# Patient Record
Sex: Male | Born: 1975 | Race: White | Hispanic: No | Marital: Single | Smoking: Current every day smoker
Health system: Southern US, Community
[De-identification: ages and names within clinical notes are randomized; demographics above are authoritative.]

## PROBLEM LIST (undated history)

## (undated) DIAGNOSIS — J45909 Unspecified asthma, uncomplicated: Secondary | ICD-10-CM

---

## 2004-06-12 ENCOUNTER — Emergency Department: Payer: Self-pay | Admitting: Emergency Medicine

## 2004-07-07 ENCOUNTER — Emergency Department: Payer: Self-pay | Admitting: Emergency Medicine

## 2004-10-12 ENCOUNTER — Emergency Department: Payer: Self-pay | Admitting: Emergency Medicine

## 2005-02-11 ENCOUNTER — Emergency Department: Payer: Self-pay | Admitting: Emergency Medicine

## 2005-07-10 ENCOUNTER — Emergency Department: Payer: Self-pay | Admitting: Emergency Medicine

## 2005-07-10 IMAGING — CR LEFT WRIST - COMPLETE 3+ VIEW
1 series · 4 of 4 positions shown · non-contrast
Comparison: none

REASON FOR EXAM: Fall, wrist pain
COMMENTS:

PROCEDURE:     DXR - DXR WRIST LT COMP WITH OBLIQUES  - July 10, 2005  [DATE]
RESULT:     Four views of the LEFT wrist show no fracture, dislocation or
other acute bony abnormality.

[Series 1: view not recorded · 0.17mm/px · 4 of 4 slices shown]
[im 1/4]
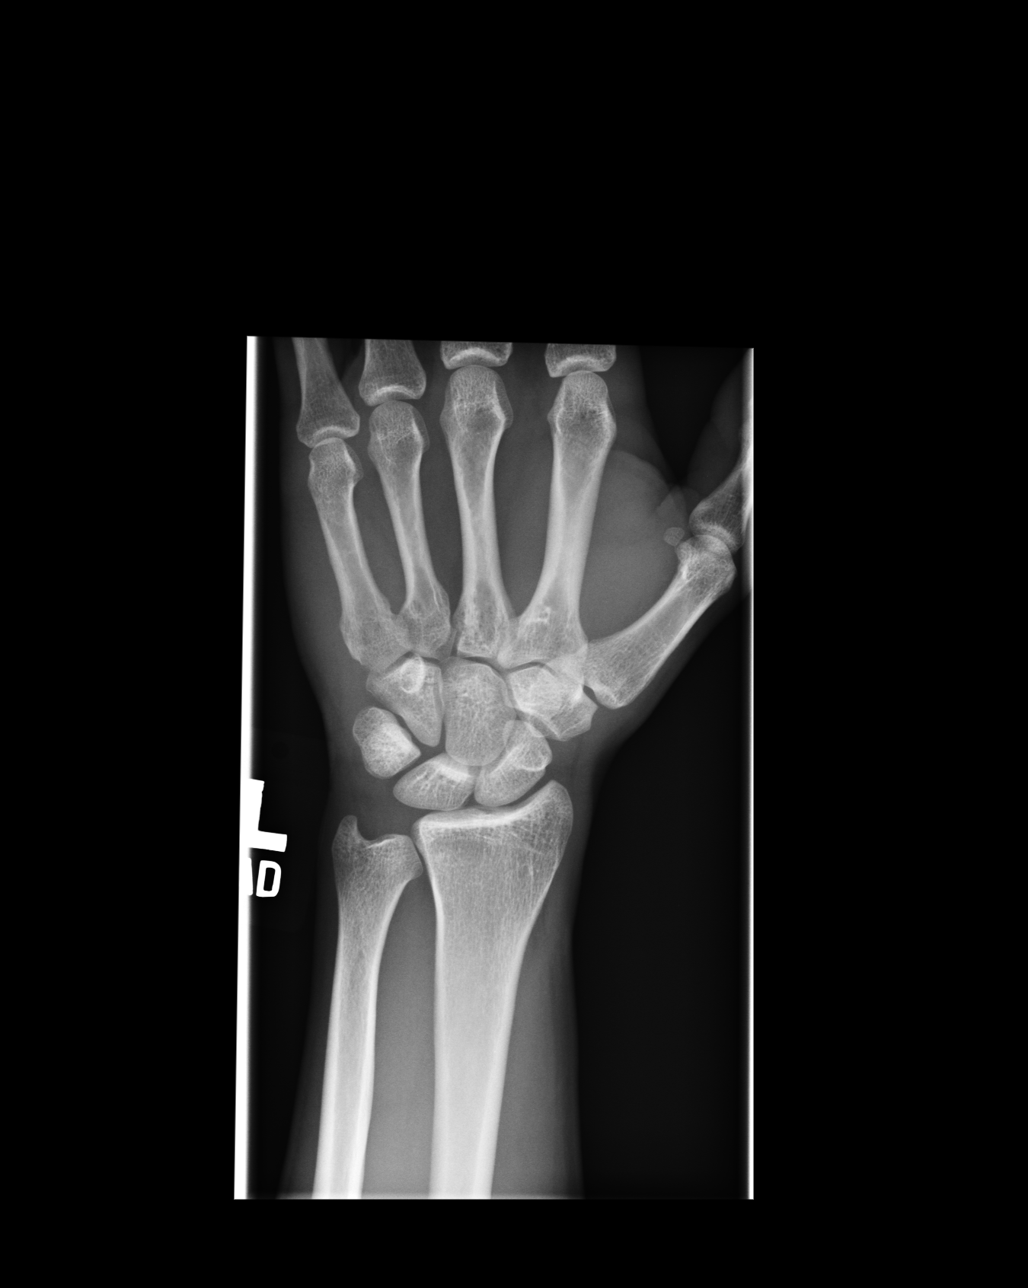
[im 2/4]
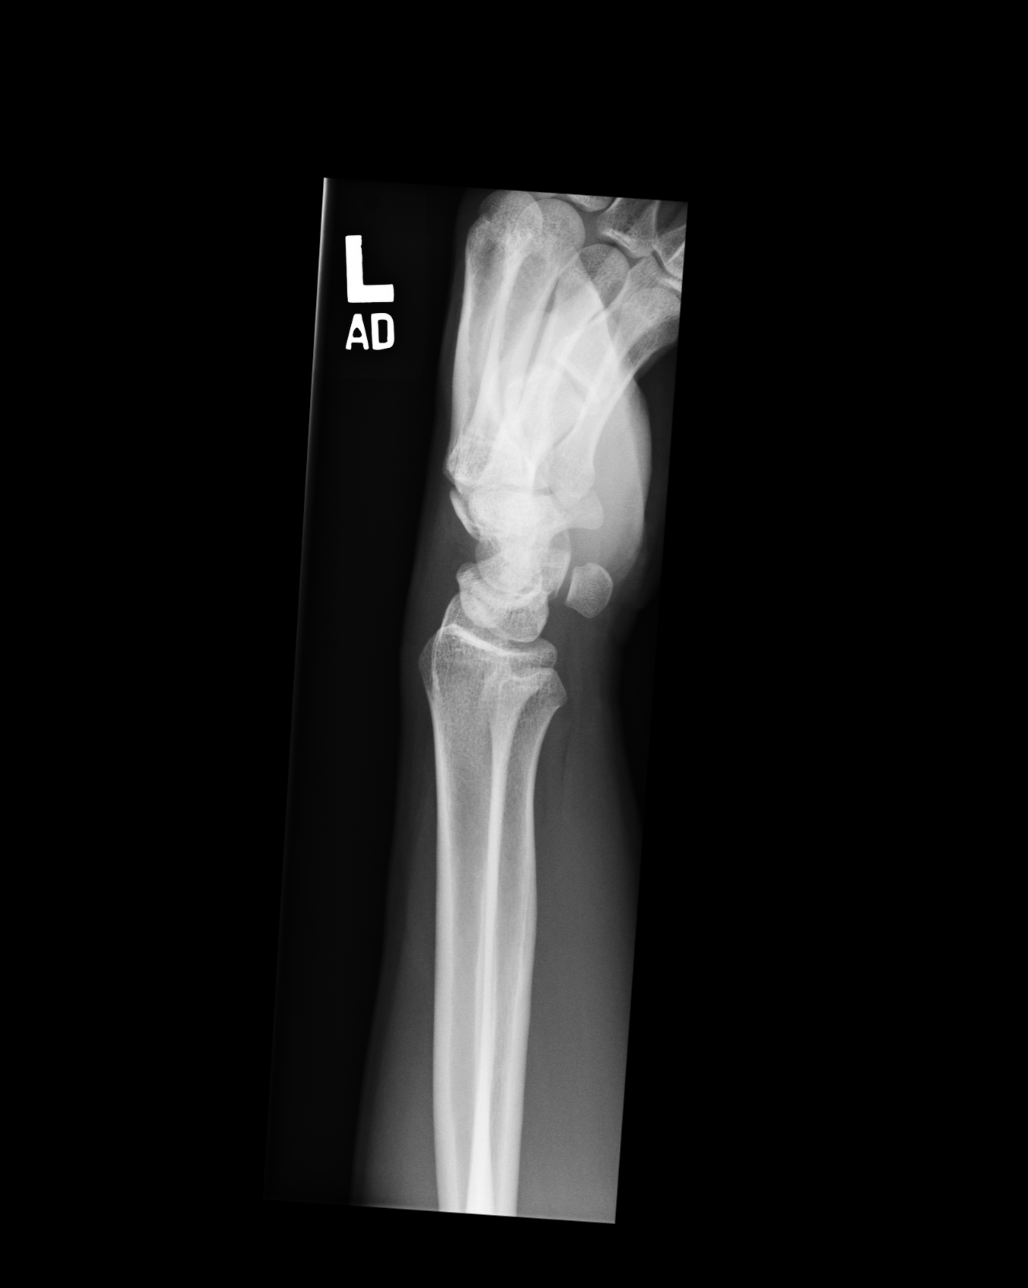
[im 3/4]
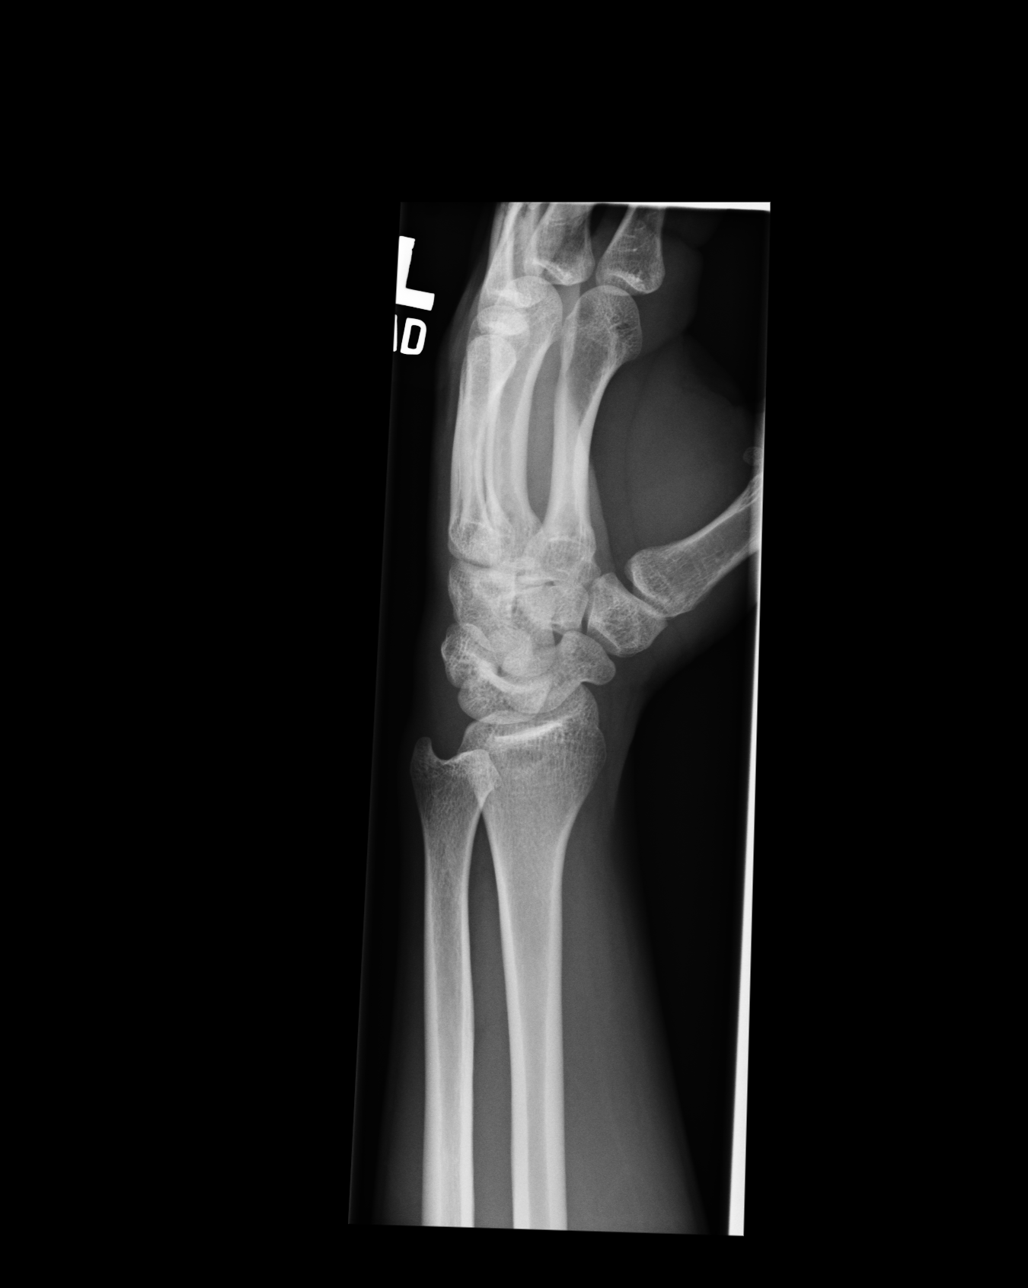
[im 4/4]
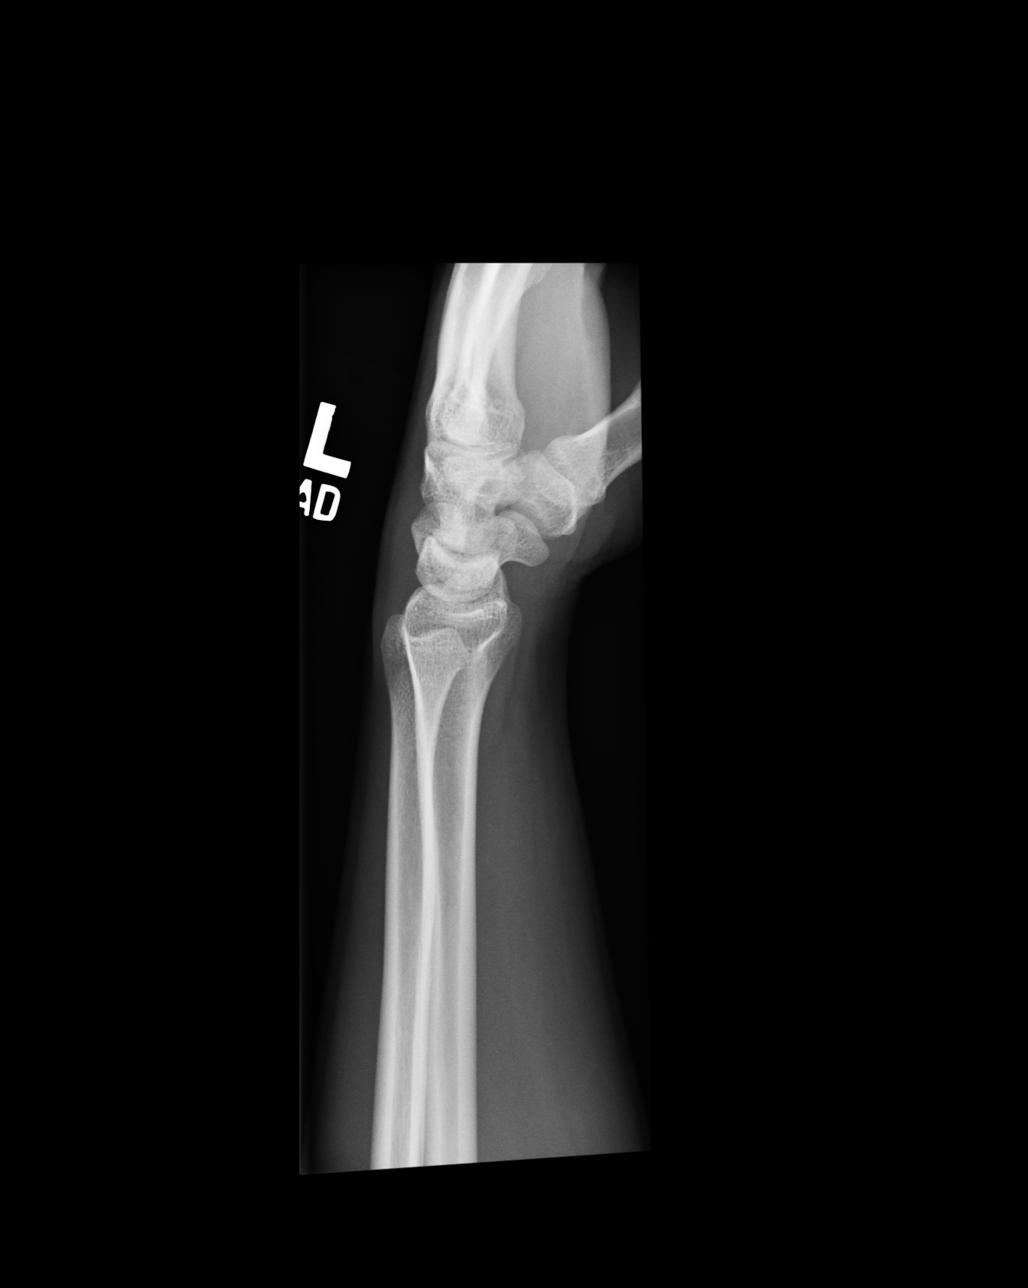

[4 of 4 positions shown; findings below may reference images not displayed]

IMPRESSION: No acute changes are identified.

## 2005-10-21 ENCOUNTER — Emergency Department: Payer: Self-pay | Admitting: Internal Medicine

## 2005-12-06 ENCOUNTER — Emergency Department: Payer: Self-pay | Admitting: Emergency Medicine

## 2006-03-06 ENCOUNTER — Emergency Department: Payer: Self-pay | Admitting: Emergency Medicine
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
## 2006-07-08 ENCOUNTER — Emergency Department: Payer: Self-pay | Admitting: General Practice

## 2006-08-01 ENCOUNTER — Emergency Department: Payer: Self-pay | Admitting: Unknown Physician Specialty

## 2006-08-19 ENCOUNTER — Emergency Department: Payer: Self-pay | Admitting: Emergency Medicine

## 2006-08-19 IMAGING — CR DG CHEST 2V
1 series · 2 of 2 positions shown · non-contrast
Comparison: none

REASON FOR EXAM: DIFFICULTY BREATHING
COMMENTS:  LMP: (Male)

[Series 1: view not recorded · 0.17mm/px · 2 of 2 slices shown]
[im 1/2]
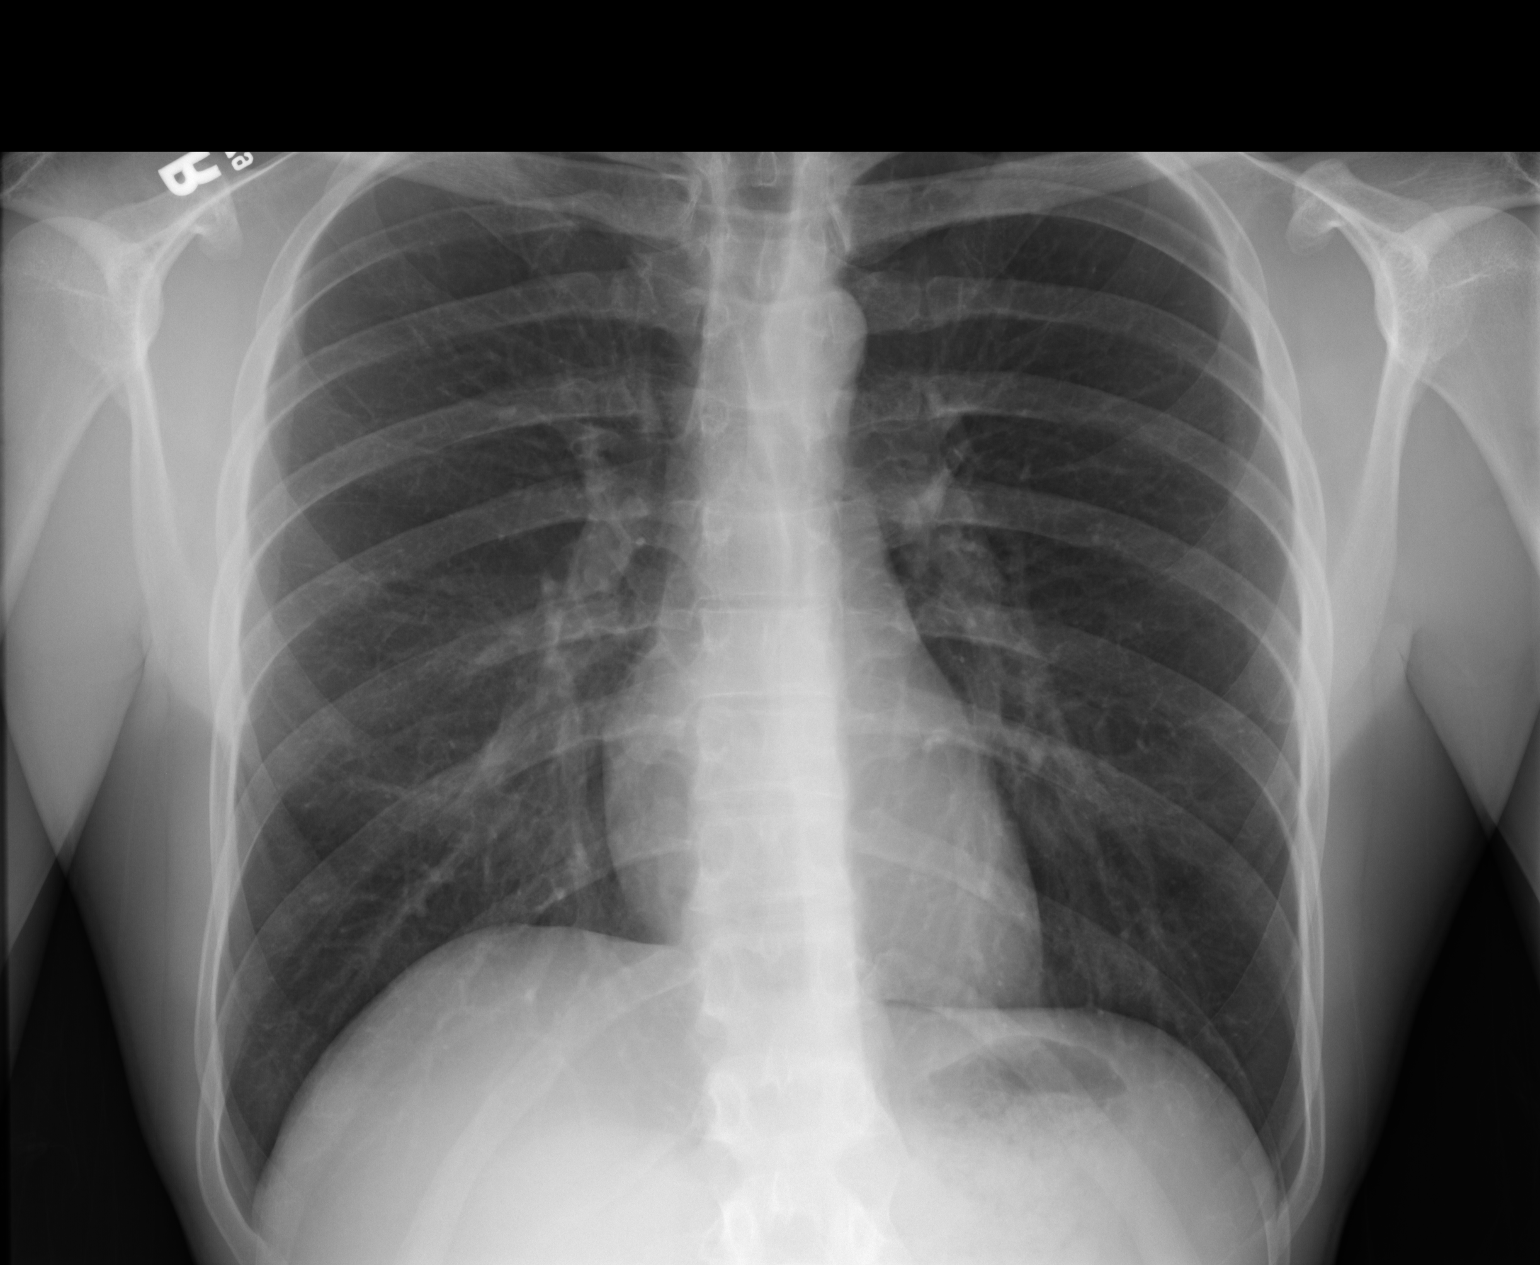
[im 2/2]
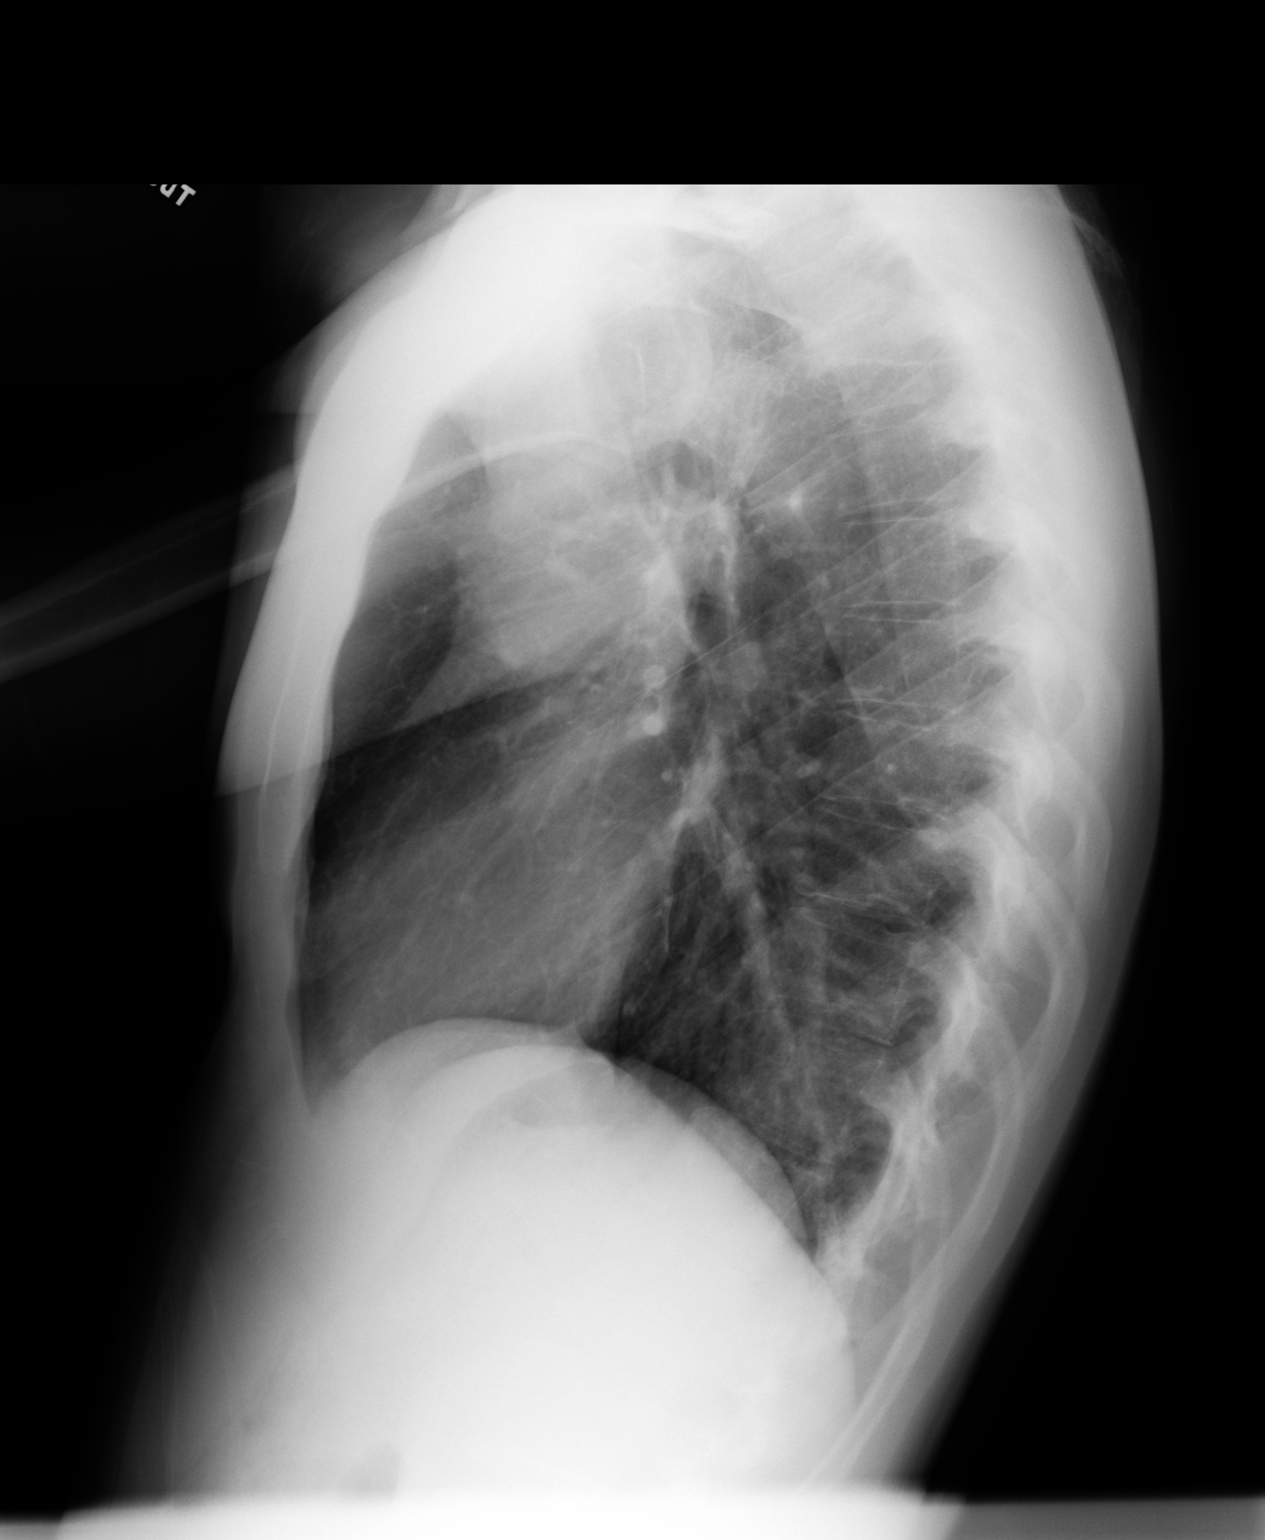

[2 of 2 positions shown; findings below may reference images not displayed]

PROCEDURE:     DXR - DXR CHEST PA (OR AP) AND LATERAL  - August 19, 2006  [DATE]

RESULT:     The lungs are mildly hyperinflated. The perihilar lung markings
are coarse, especially on the RIGHT inferiorly. There is no alveolar
infiltrate and no pleural effusion. The heart is not enlarged and the
pulmonary vascularity does not appear engorged.
IMPRESSION: There is mild hyperinflation and there may be early
subsegmental atelectasis. These findings would suggest an element of acute
bronchitis superimposed upon reactive airway disease. Follow-up films are
recommended if the patient's dyspnea persists. I do not see evidence of a
pneumothorax.

## 2006-08-19 IMAGING — CR DG SHOULDER 3+V*L*
1 series · 3 of 3 positions shown · non-contrast
Comparison: none

REASON FOR EXAM: PAIN  RME 3
COMMENTS:  LMP: (Male)

PROCEDURE:     DXR - DXR SHOULDER LEFT COMPLETE  - August 19, 2006  [DATE]
RESULT:     The patient is complaining of LEFT shoulder discomfort.
Three views of the shoulder reveal no evidence of fracture nor dislocation
or significant degenerative change.  The AC joint is grossly normal.

[Series 1: view not recorded · 0.17mm/px · 3 of 3 slices shown]
[im 1/3]
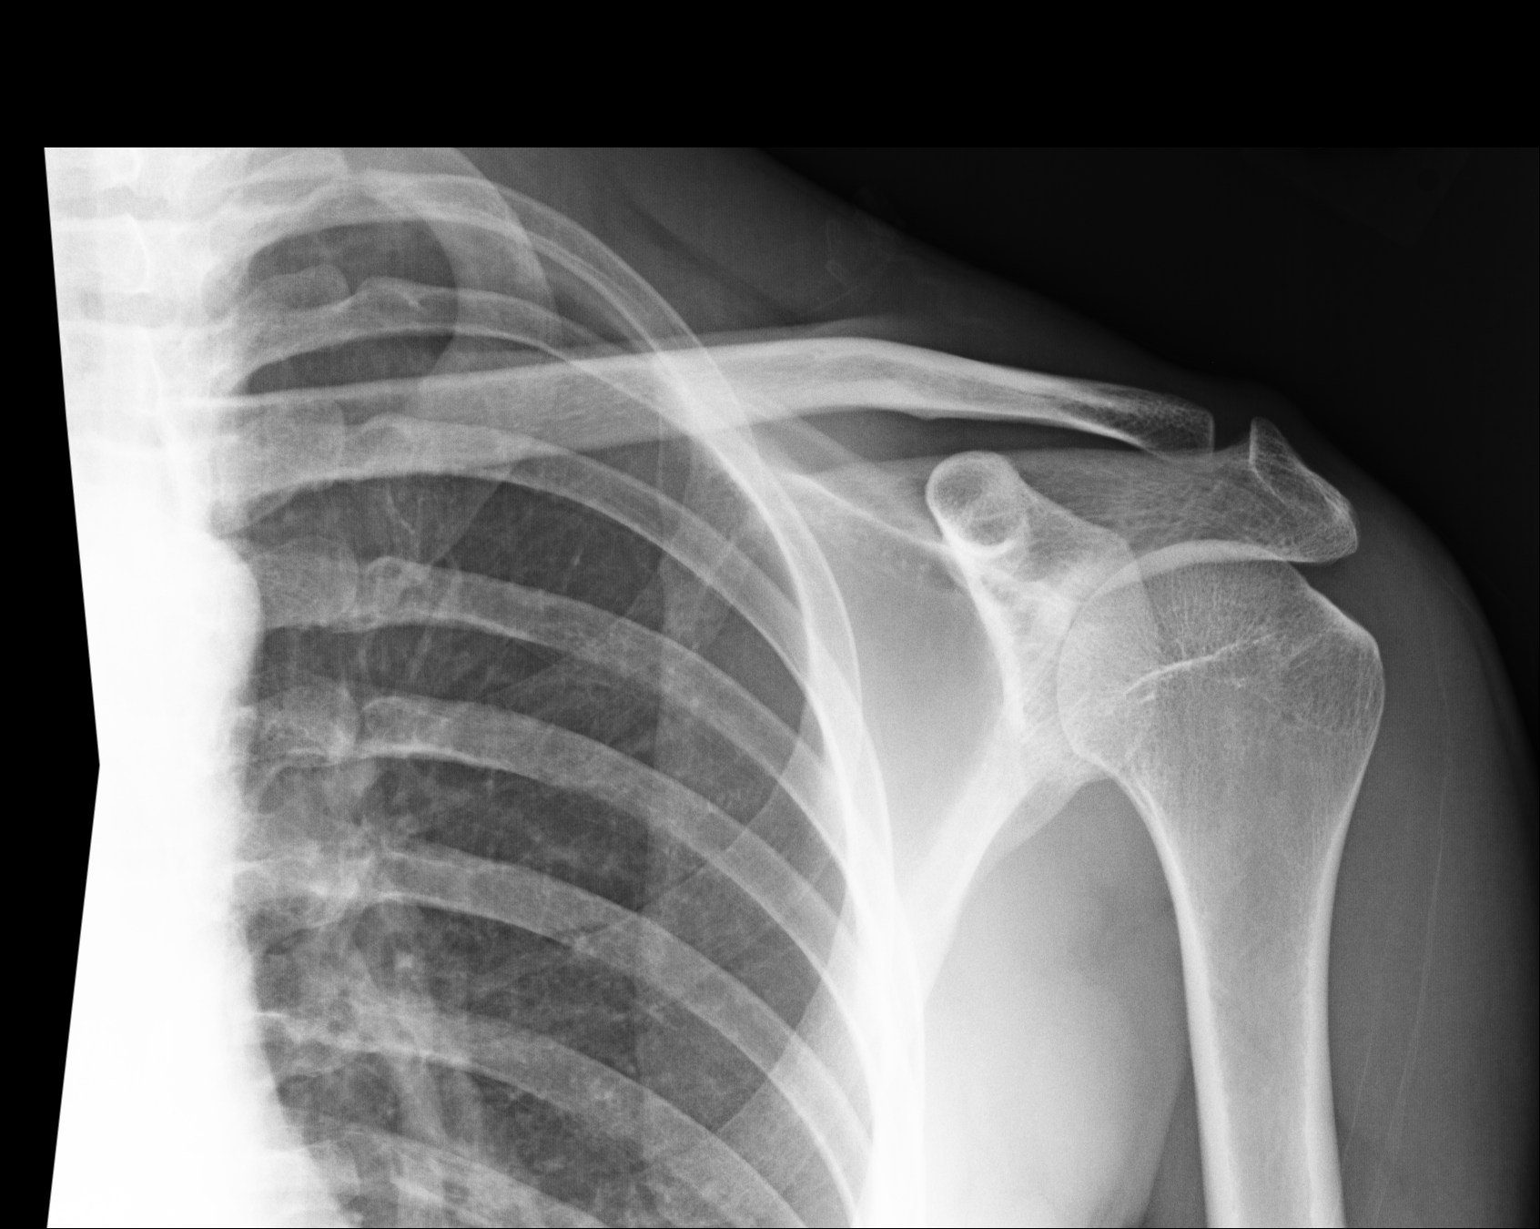
[im 2/3]
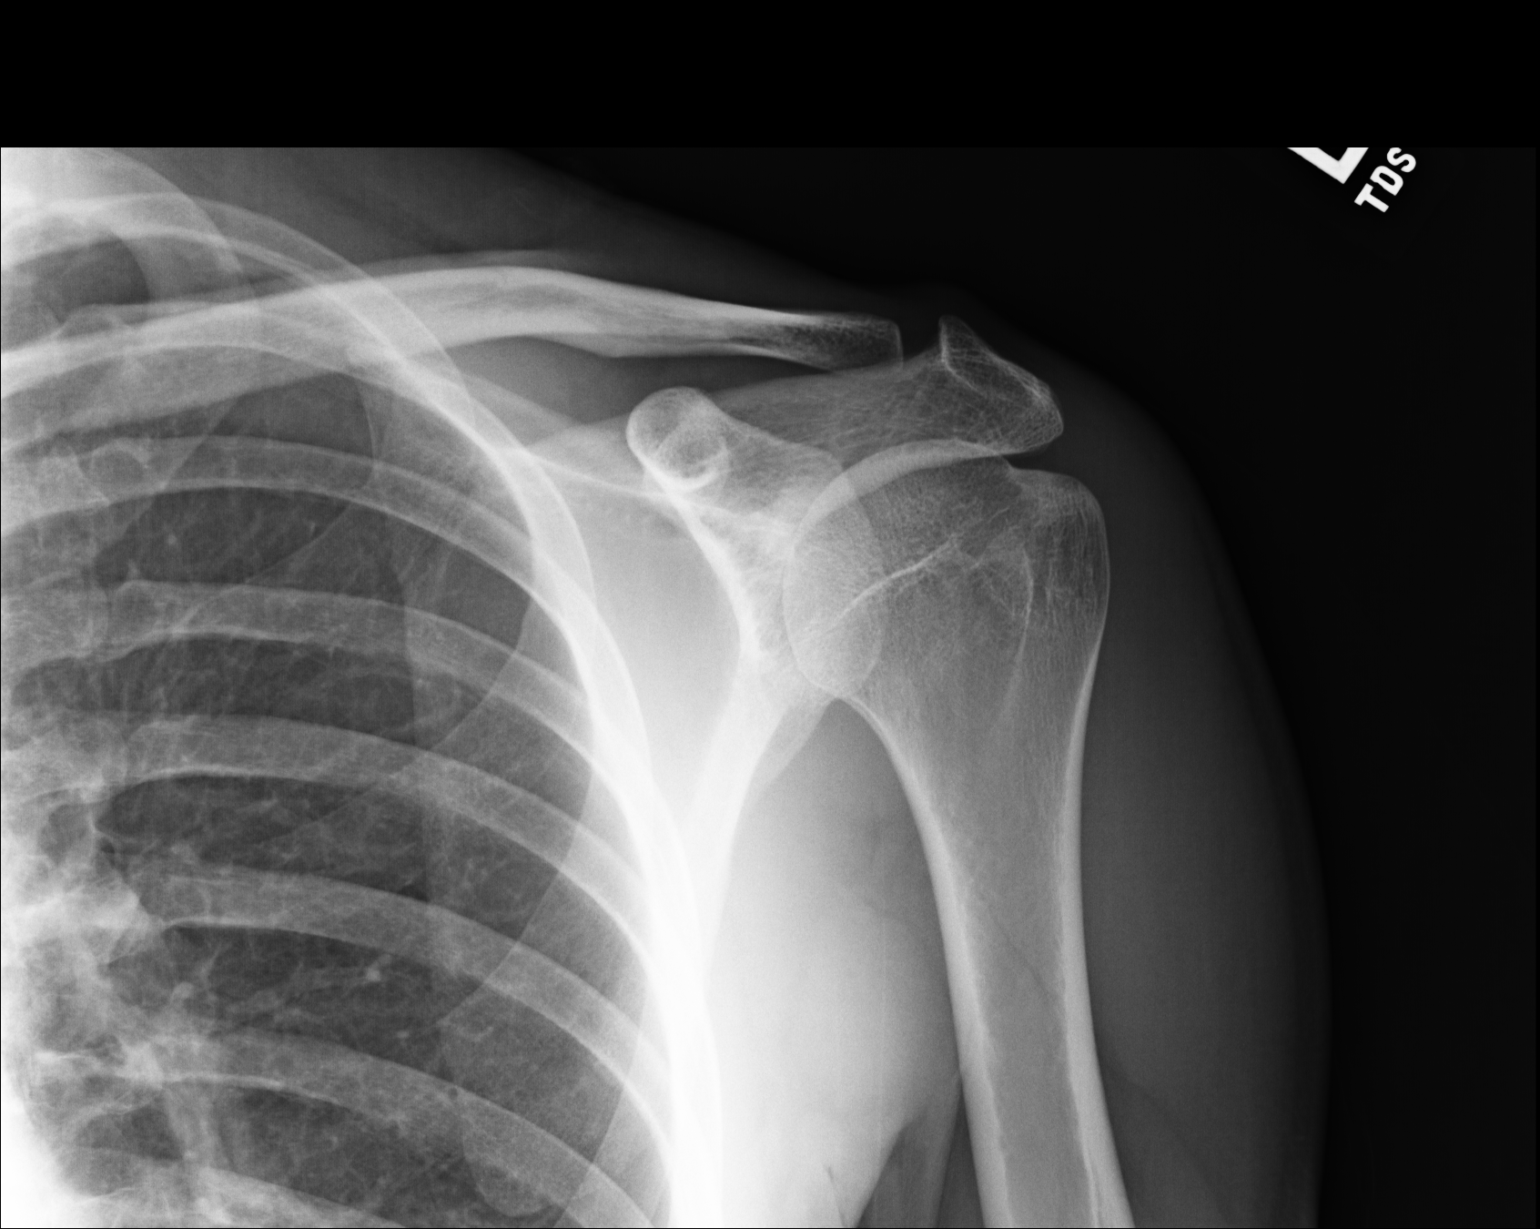
[im 3/3]
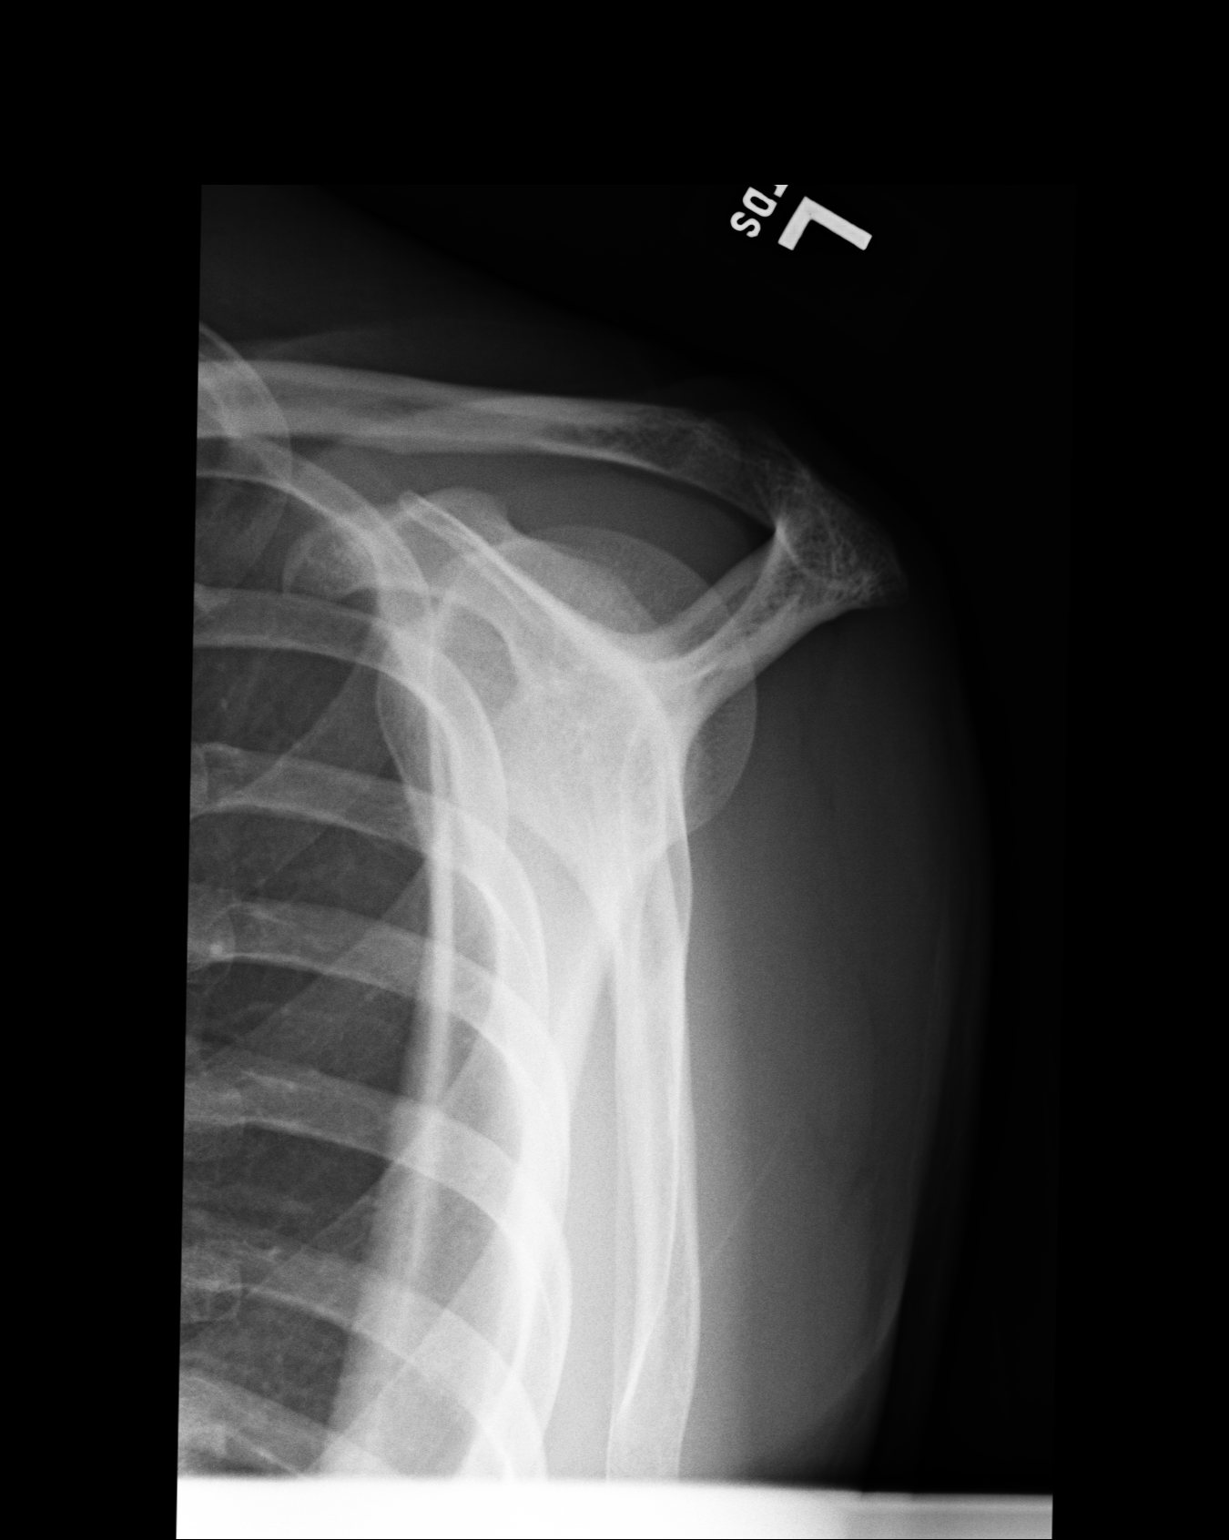

[3 of 3 positions shown; findings below may reference images not displayed]

IMPRESSION: 1)I see no acute bony abnormality of the LEFT shoulder.  If symptoms
persist, further evaluation with MRI may be a useful next step.

## 2007-05-24 ENCOUNTER — Emergency Department: Payer: Self-pay | Admitting: Unknown Physician Specialty

## 2007-06-26 ENCOUNTER — Emergency Department: Payer: Self-pay | Admitting: Unknown Physician Specialty

## 2007-07-07 ENCOUNTER — Emergency Department: Payer: Self-pay | Admitting: Internal Medicine

## 2007-08-07 ENCOUNTER — Inpatient Hospital Stay: Payer: Self-pay | Admitting: Internal Medicine

## 2007-08-07 ENCOUNTER — Other Ambulatory Visit: Payer: Self-pay

## 2007-10-22 ENCOUNTER — Emergency Department: Payer: Self-pay | Admitting: Emergency Medicine

## 2007-10-22 IMAGING — CR DG ABDOMEN 3V
1 series · 5 of 5 positions shown · non-contrast
Comparison: none

REASON FOR EXAM: abdominal pain
COMMENTS:

[Series 1: view not recorded · 0.17mm/px · 5 of 5 slices shown]
[im 1/5]
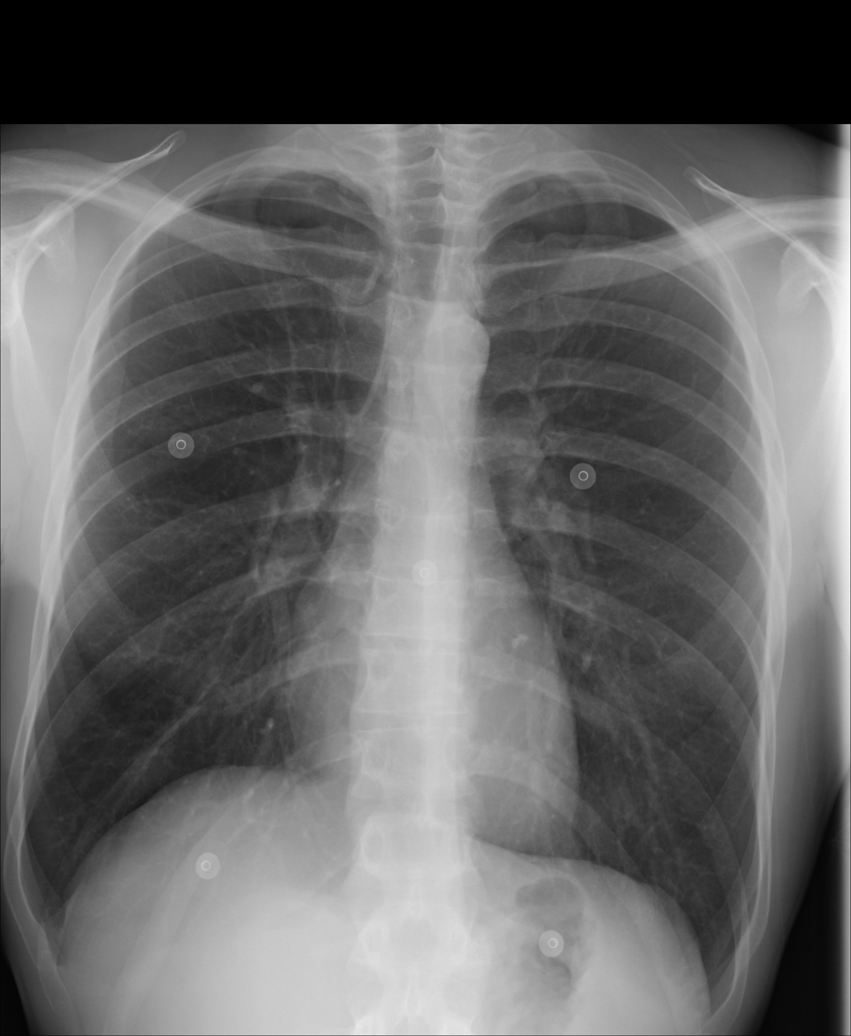
[im 2/5]
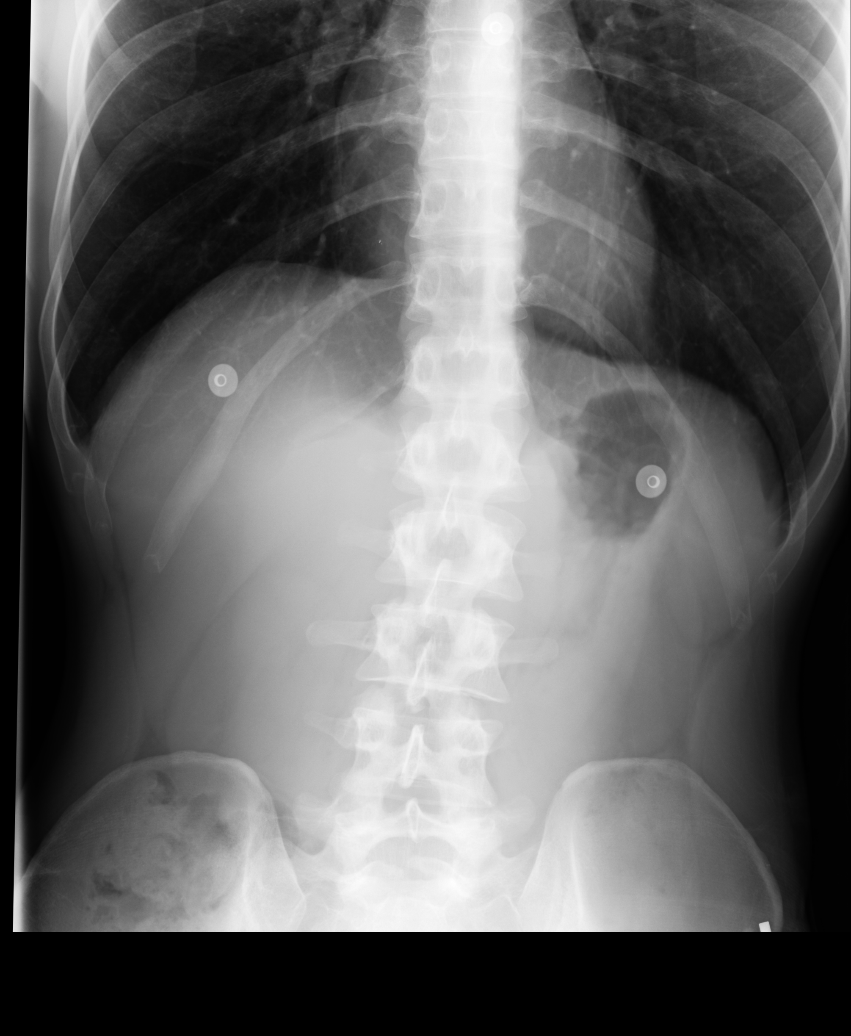
[im 3/5]
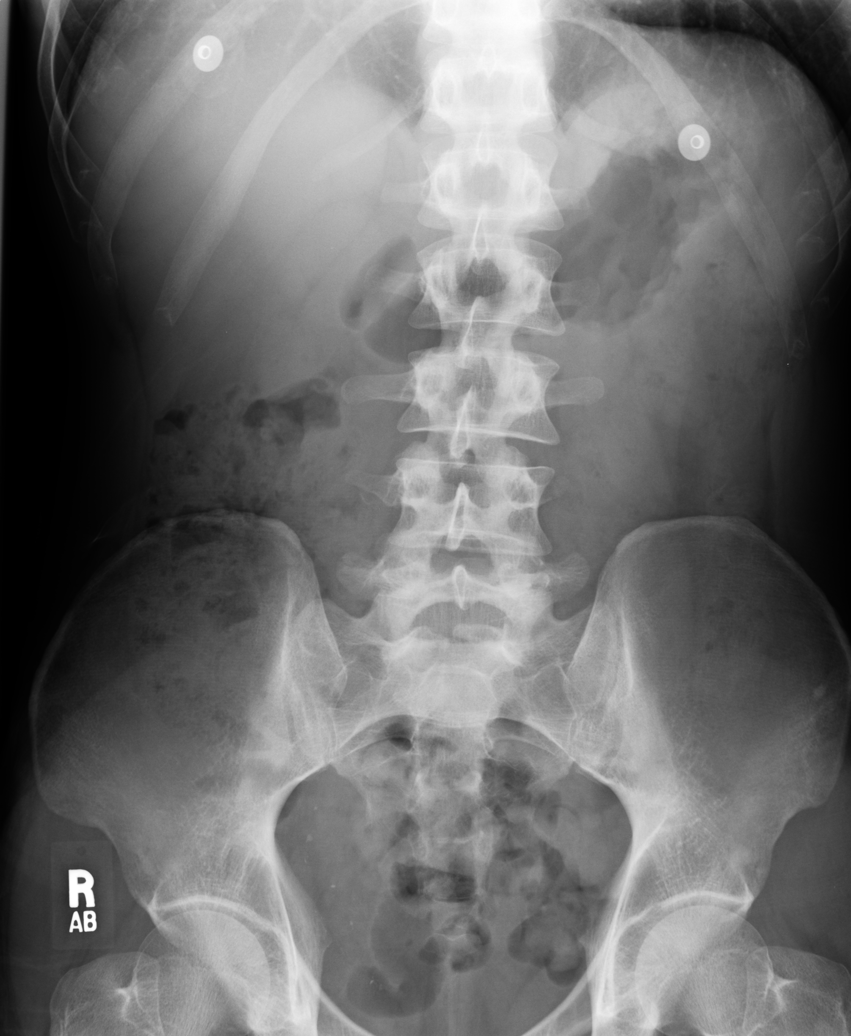
[im 4/5]
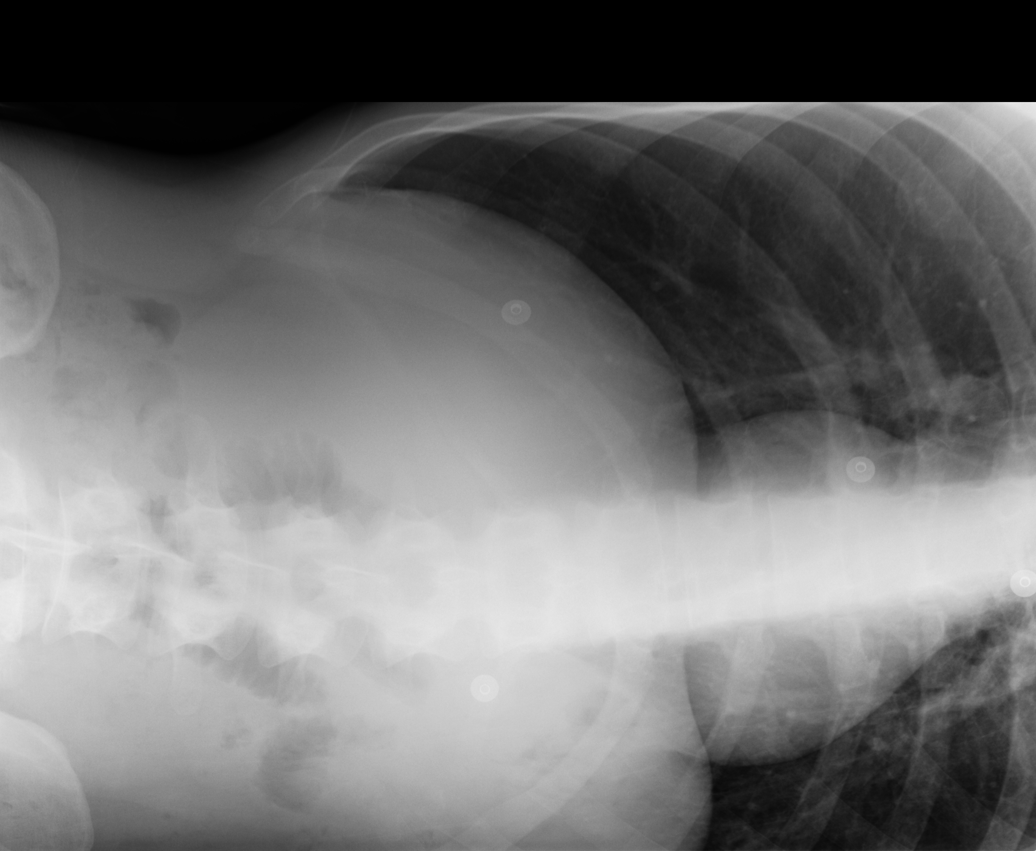
[im 5/5]
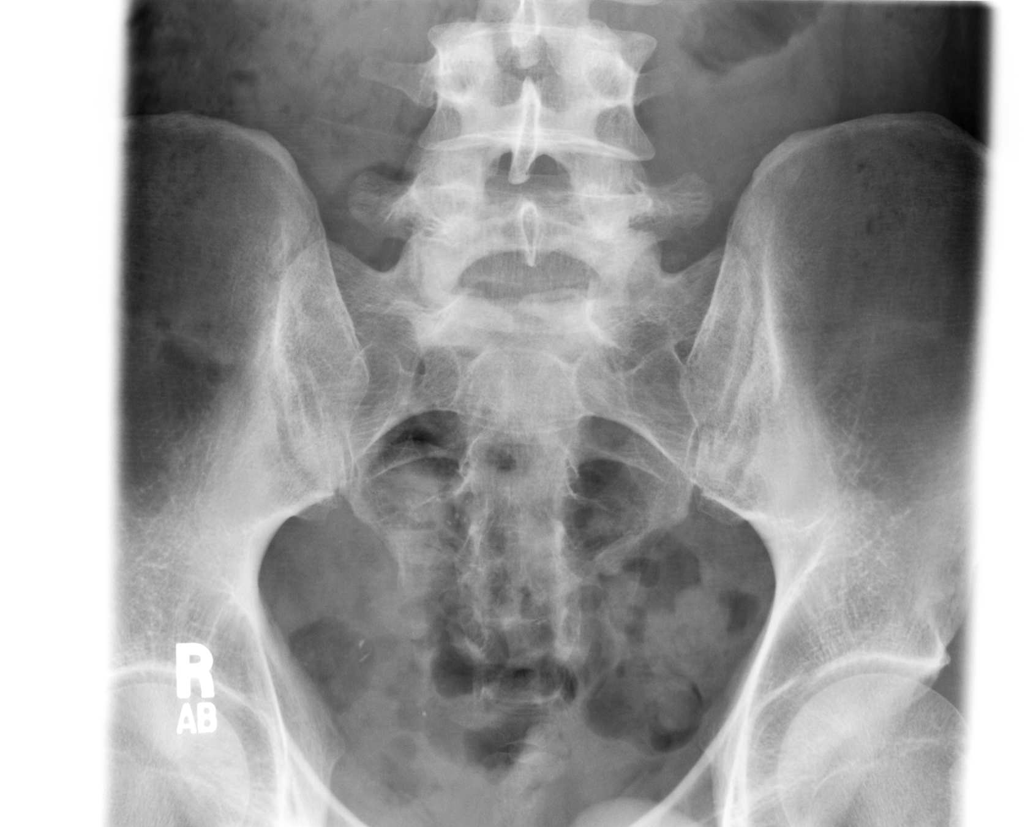

[5 of 5 positions shown; findings below may reference images not displayed]

PROCEDURE:     DXR - DXR ABDOMEN 3-WAY (INCL PA CXR)  - October 22, 2007 [DATE]

RESULT:     The lung fields are clear. Heart size is normal. Four views of
the abdomen were obtained. No subdiaphragmatic free air is seen. The bowel
gas pattern is normal. No abnormal intraabdominal calcifications are seen.
The osseous structures are normal in appearance.
IMPRESSION: 1.     No acute changes are identified.

## 2007-11-10 ENCOUNTER — Emergency Department: Payer: Self-pay | Admitting: Emergency Medicine
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
## 2012-03-16 LAB — COMPREHENSIVE METABOLIC PANEL
Anion Gap: 5 — ABNORMAL LOW (ref 7–16)
BUN: 13 mg/dL (ref 7–18)
Bilirubin,Total: 0.4 mg/dL (ref 0.2–1.0)
Calcium, Total: 9.8 mg/dL (ref 8.5–10.1)
Chloride: 105 mmol/L (ref 98–107)
Co2: 32 mmol/L (ref 21–32)
Creatinine: 1.01 mg/dL (ref 0.60–1.30)
EGFR (African American): >60
EGFR (Non-African Amer.): >60
Osmolality: 282 (ref 275–301)
Potassium: 3.8 mmol/L (ref 3.5–5.1)
Sodium: 142 mmol/L (ref 136–145)
Total Protein: 7.8 g/dL (ref 6.4–8.2)

## 2012-03-16 LAB — URINALYSIS, COMPLETE
Bilirubin,UR: NEGATIVE
Glucose,UR: NEGATIVE mg/dL (ref 0–75)
Ketone: NEGATIVE
Leukocyte Esterase: NEGATIVE
RBC,UR: 1 /HPF (ref 0–5)
WBC UR: 1 /HPF (ref 0–5)

## 2012-03-16 LAB — DRUG SCREEN, URINE
Amphetamines, Ur Screen: NEGATIVE (ref ?–1000)
Benzodiazepine, Ur Scrn: NEGATIVE (ref ?–200)
Cocaine Metabolite,Ur ~~LOC~~: NEGATIVE (ref ?–300)
MDMA (Ecstasy)Ur Screen: NEGATIVE (ref ?–500)
Methadone, Ur Screen: NEGATIVE (ref ?–300)
Phencyclidine (PCP) Ur S: NEGATIVE (ref ?–25)
Tricyclic, Ur Screen: NEGATIVE (ref ?–1000)

## 2012-03-16 LAB — CBC
HCT: 43.5 % (ref 40.0–52.0)
HGB: 15.1 g/dL (ref 13.0–18.0)
MCH: 31.7 pg (ref 26.0–34.0)
MCHC: 34.7 g/dL (ref 32.0–36.0)
RBC: 4.75 10*6/uL (ref 4.40–5.90)
WBC: 8.5 10*3/uL (ref 3.8–10.6)

## 2012-03-16 LAB — ETHANOL: Ethanol %: <0.003 % (ref 0.000–0.080)

## 2012-03-16 LAB — TSH: Thyroid Stimulating Horm: 1.52 u[IU]/mL

## 2012-03-17 ENCOUNTER — Inpatient Hospital Stay: Payer: Self-pay | Admitting: Psychiatry

## 2013-01-21 ENCOUNTER — Ambulatory Visit: Payer: Self-pay | Admitting: Unknown Physician Specialty
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
## 2014-01-06 ENCOUNTER — Emergency Department: Payer: Self-pay | Admitting: Emergency Medicine

## 2014-01-06 LAB — BASIC METABOLIC PANEL
Anion Gap: 3 — ABNORMAL LOW (ref 7–16)
BUN: 15 mg/dL (ref 7–18)
Calcium, Total: 9.4 mg/dL (ref 8.5–10.1)
Chloride: 106 mmol/L (ref 98–107)
Co2: 29 mmol/L (ref 21–32)
Creatinine: 1.21 mg/dL (ref 0.60–1.30)
EGFR (African American): >60
GLUCOSE: 95 mg/dL (ref 65–99)
OSMOLALITY: 276 (ref 275–301)
Potassium: 3.6 mmol/L (ref 3.5–5.1)
Sodium: 138 mmol/L (ref 136–145)

## 2014-01-06 LAB — CBC WITH DIFFERENTIAL/PLATELET
Basophil #: 0.1 10*3/uL (ref 0.0–0.1)
Basophil %: 1.9 %
Eosinophil #: 0.1 10*3/uL (ref 0.0–0.7)
Eosinophil %: 2.1 %
HCT: 45.1 % (ref 40.0–52.0)
HGB: 15.5 g/dL (ref 13.0–18.0)
Lymphocyte #: 2.7 10*3/uL (ref 1.0–3.6)
Lymphocyte %: 42.6 %
MCH: 32 pg (ref 26.0–34.0)
MCHC: 34.3 g/dL (ref 32.0–36.0)
MCV: 93 fL (ref 80–100)
MONOS PCT: 6.3 %
Monocyte #: 0.4 x10 3/mm (ref 0.2–1.0)
NEUTROS ABS: 3 10*3/uL (ref 1.4–6.5)
Neutrophil %: 47.1 %
PLATELETS: 230 10*3/uL (ref 150–440)
RBC: 4.83 10*6/uL (ref 4.40–5.90)
RDW: 13 % (ref 11.5–14.5)
WBC: 6.4 10*3/uL (ref 3.8–10.6)

## 2014-01-06 LAB — URINALYSIS, COMPLETE
BILIRUBIN, UR: NEGATIVE
Bacteria: NONE SEEN
Blood: NEGATIVE
Glucose,UR: NEGATIVE mg/dL (ref 0–75)
Hyaline Cast: 2
KETONE: NEGATIVE
NITRITE: NEGATIVE
Ph: 6 (ref 4.5–8.0)
Protein: NEGATIVE
RBC,UR: 2 /HPF (ref 0–5)
Specific Gravity: 1.028 (ref 1.003–1.030)
Squamous Epithelial: NONE SEEN
WBC UR: 6 /HPF (ref 0–5)

## 2014-02-14 ENCOUNTER — Emergency Department: Payer: Self-pay | Admitting: Emergency Medicine

## 2014-02-14 LAB — LIPASE, BLOOD: Lipase: 93 U/L (ref 73–393)

## 2014-02-14 LAB — COMPREHENSIVE METABOLIC PANEL
ALK PHOS: 64 U/L
ANION GAP: 6 — AB (ref 7–16)
AST: 17 U/L (ref 15–37)
Albumin: 3.3 g/dL — ABNORMAL LOW (ref 3.4–5.0)
BILIRUBIN TOTAL: 0.5 mg/dL (ref 0.2–1.0)
BUN: 10 mg/dL (ref 7–18)
Calcium, Total: 8.6 mg/dL (ref 8.5–10.1)
Chloride: 107 mmol/L (ref 98–107)
Co2: 26 mmol/L (ref 21–32)
Creatinine: 1.09 mg/dL (ref 0.60–1.30)
EGFR (African American): >60
EGFR (Non-African Amer.): >60
GLUCOSE: 104 mg/dL — AB (ref 65–99)
Osmolality: 277 (ref 275–301)
Potassium: 3.5 mmol/L (ref 3.5–5.1)
SGPT (ALT): 13 U/L (ref 12–78)
SODIUM: 139 mmol/L (ref 136–145)
TOTAL PROTEIN: 6.6 g/dL (ref 6.4–8.2)

## 2014-02-14 LAB — CBC WITH DIFFERENTIAL/PLATELET
Basophil #: 0 10*3/uL (ref 0.0–0.1)
Basophil %: 0.5 %
EOS PCT: 0.8 %
Eosinophil #: 0.1 10*3/uL (ref 0.0–0.7)
HCT: 44.5 % (ref 40.0–52.0)
HGB: 15.7 g/dL (ref 13.0–18.0)
LYMPHS ABS: 0.7 10*3/uL — AB (ref 1.0–3.6)
Lymphocyte %: 8.4 %
MCH: 32.2 pg (ref 26.0–34.0)
MCHC: 35.1 g/dL (ref 32.0–36.0)
MCV: 92 fL (ref 80–100)
MONOS PCT: 8.9 %
Monocyte #: 0.8 x10 3/mm (ref 0.2–1.0)
NEUTROS ABS: 7.2 10*3/uL — AB (ref 1.4–6.5)
Neutrophil %: 81.4 %
PLATELETS: 234 10*3/uL (ref 150–440)
RBC: 4.87 10*6/uL (ref 4.40–5.90)
RDW: 12.7 % (ref 11.5–14.5)
WBC: 8.8 10*3/uL (ref 3.8–10.6)

## 2014-02-14 LAB — URINALYSIS, COMPLETE
BILIRUBIN, UR: NEGATIVE
Blood: NEGATIVE
GLUCOSE, UR: NEGATIVE mg/dL (ref 0–75)
LEUKOCYTE ESTERASE: NEGATIVE
Nitrite: NEGATIVE
Ph: 7 (ref 4.5–8.0)
Protein: NEGATIVE
RBC,UR: 2 /HPF (ref 0–5)
Specific Gravity: 1.021 (ref 1.003–1.030)
Squamous Epithelial: 1
WBC UR: 1 /HPF (ref 0–5)

## 2014-02-14 IMAGING — CR DG CHEST 2V
1 series · 2 of 2 positions shown · non-contrast
Comparison: None.

CLINICAL DATA: Cough, vomiting, sore throat and fever. Lower back
pain.

EXAM:
CHEST  2 VIEW

[Series 1: pa · 0.17mm/px · 2 of 2 slices shown]
[im 1/2]
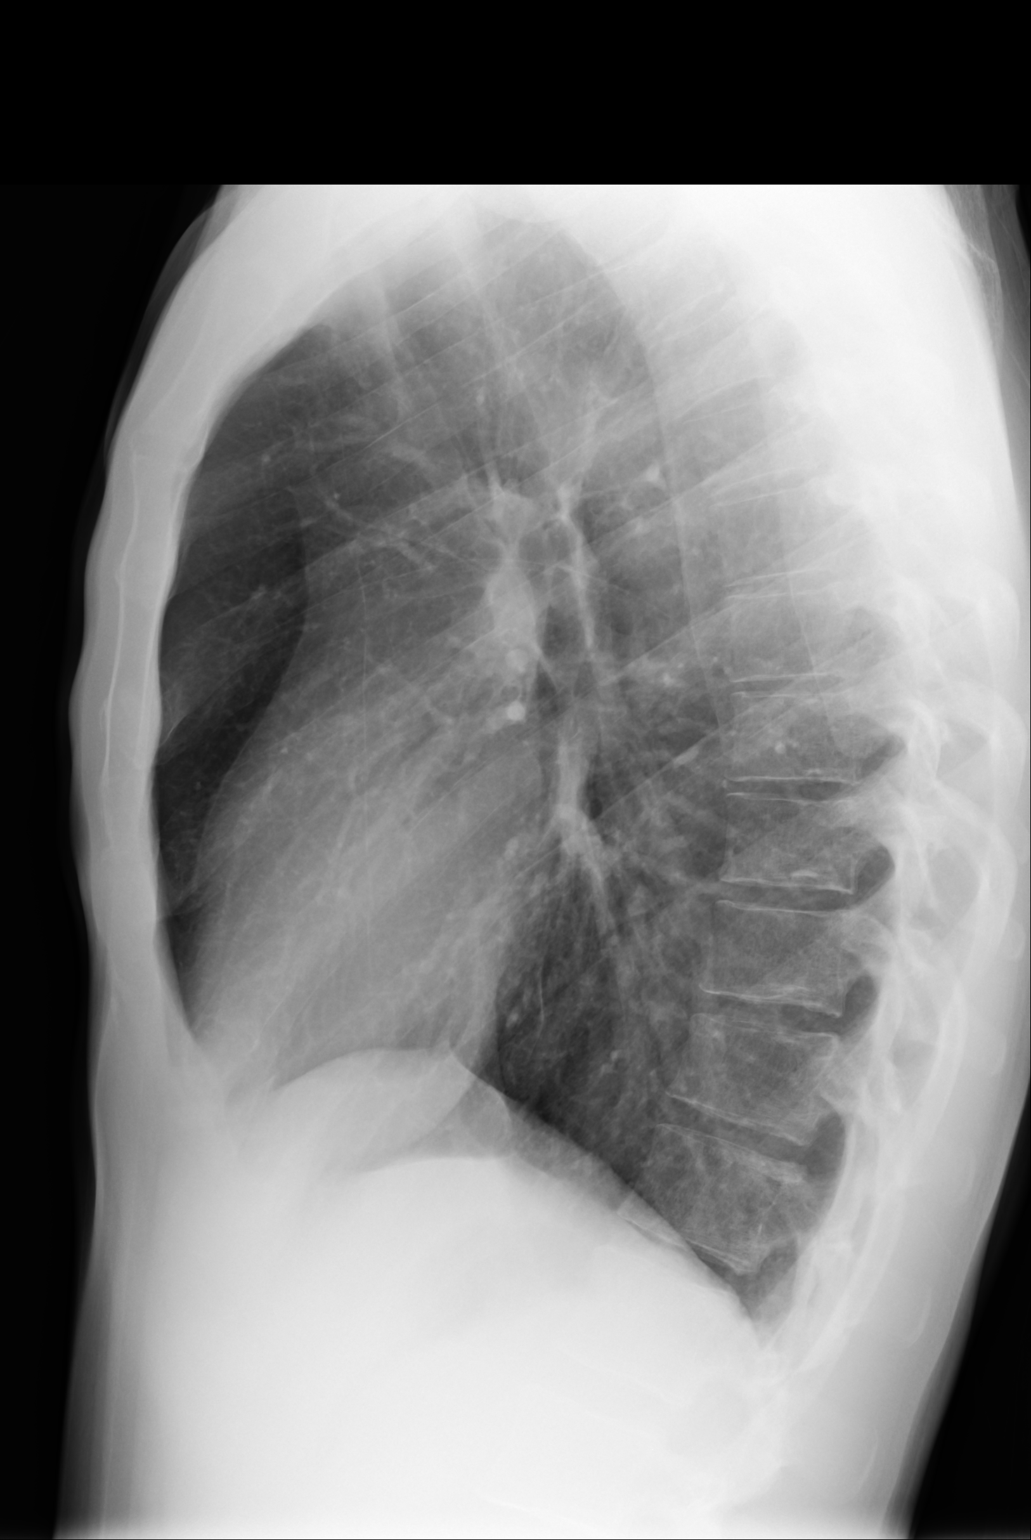
[im 2/2]
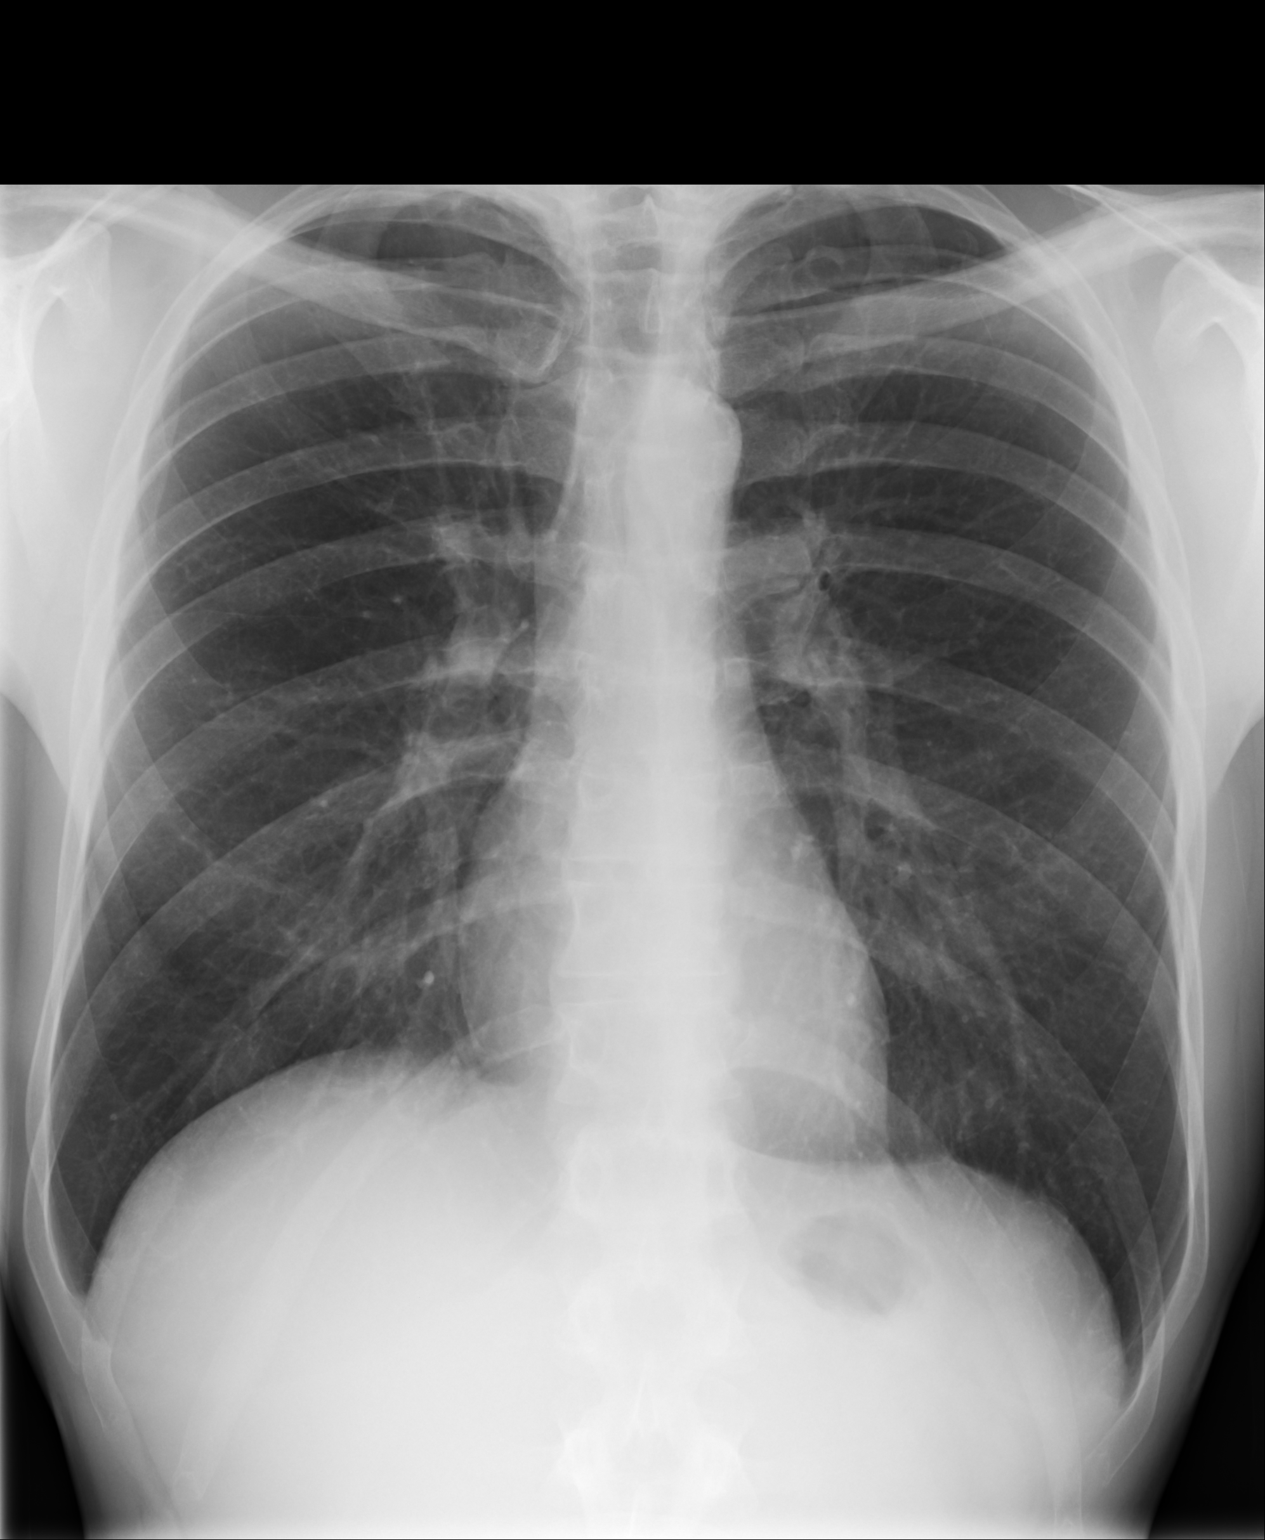

[2 of 2 positions shown; findings below may reference images not displayed]

FINDINGS: The lungs are well-aerated and clear. There is no evidence of focal
opacification, pleural effusion or pneumothorax.

The heart is normal in size; the mediastinal contour is within
normal limits. No acute osseous abnormalities are seen.
IMPRESSION: No acute cardiopulmonary process seen.

## 2014-03-31 LAB — CBC
HCT: 48.9 % (ref 40.0–52.0)
HGB: 16.4 g/dL (ref 13.0–18.0)
MCH: 31.5 pg (ref 26.0–34.0)
MCHC: 33.6 g/dL (ref 32.0–36.0)
MCV: 94 fL (ref 80–100)
PLATELETS: 262 10*3/uL (ref 150–440)
RBC: 5.21 10*6/uL (ref 4.40–5.90)
RDW: 13.4 % (ref 11.5–14.5)
WBC: 10.7 10*3/uL — ABNORMAL HIGH (ref 3.8–10.6)

## 2014-04-01 ENCOUNTER — Inpatient Hospital Stay: Payer: Self-pay | Admitting: Psychiatry

## 2014-04-01 LAB — COMPREHENSIVE METABOLIC PANEL
ALT: 16 U/L
AST: 17 U/L (ref 15–37)
Albumin: 3.9 g/dL (ref 3.4–5.0)
Alkaline Phosphatase: 73 U/L
Anion Gap: 5 — ABNORMAL LOW (ref 7–16)
BUN: 14 mg/dL (ref 7–18)
Bilirubin,Total: 0.3 mg/dL (ref 0.2–1.0)
CALCIUM: 9 mg/dL (ref 8.5–10.1)
CHLORIDE: 107 mmol/L (ref 98–107)
CO2: 28 mmol/L (ref 21–32)
CREATININE: 1.09 mg/dL (ref 0.60–1.30)
EGFR (Non-African Amer.): >60
Glucose: 89 mg/dL (ref 65–99)
Osmolality: 279 (ref 275–301)
Potassium: 3.4 mmol/L — ABNORMAL LOW (ref 3.5–5.1)
SODIUM: 140 mmol/L (ref 136–145)
Total Protein: 7.1 g/dL (ref 6.4–8.2)

## 2014-04-01 LAB — URINALYSIS, COMPLETE
Bilirubin,UR: NEGATIVE
Blood: NEGATIVE
Glucose,UR: NEGATIVE mg/dL (ref 0–75)
Ketone: NEGATIVE
Nitrite: NEGATIVE
Ph: 5 (ref 4.5–8.0)
Protein: NEGATIVE
Specific Gravity: 1.027 (ref 1.003–1.030)
Squamous Epithelial: <1

## 2014-04-01 LAB — DRUG SCREEN, URINE
Amphetamines, Ur Screen: NEGATIVE (ref ?–1000)
Barbiturates, Ur Screen: NEGATIVE (ref ?–200)
Benzodiazepine, Ur Scrn: NEGATIVE (ref ?–200)
CANNABINOID 50 NG, UR ~~LOC~~: POSITIVE (ref ?–50)
COCAINE METABOLITE, UR ~~LOC~~: NEGATIVE (ref ?–300)
MDMA (ECSTASY) UR SCREEN: NEGATIVE (ref ?–500)
METHADONE, UR SCREEN: NEGATIVE (ref ?–300)
OPIATE, UR SCREEN: NEGATIVE (ref ?–300)
Phencyclidine (PCP) Ur S: NEGATIVE (ref ?–25)
TRICYCLIC, UR SCREEN: NEGATIVE (ref ?–1000)

## 2014-04-01 LAB — ETHANOL: Ethanol: <3 mg/dL

## 2014-04-01 LAB — ACETAMINOPHEN LEVEL: Acetaminophen: <2 ug/mL

## 2014-04-01 LAB — SALICYLATE LEVEL: Salicylates, Serum: 3.8 mg/dL — ABNORMAL HIGH

## 2014-04-02 LAB — URINE CULTURE

## 2014-11-16 NOTE — Discharge Summary (Signed)
PATIENT NAME:  Curtis Reeves, Curtis Reeves MR#:  [id number] DATE OF BIRTH:  11/10/1975  DATE OF ADMISSION:  03/17/2012 DATE OF DISCHARGE:  03/20/2012  HOSPITAL COURSE: See dictated history and physical for details of admission. This 39 year old man with a history of mood instability and substance abuse was admitted to the hospital with worsening depression and anxiety. He wanted to get back on medication for mood stabilization. In the hospital, though he had initially reported suicidal ideation, he did not engage in any dangerous behavior. Did not attempt suicide. Reported that his suicidal ideation improved and disappeared over the course of his hospital stay. He was compliant with medication recommendations. He attended groups and participated appropriately in treatment. Because of his medication compliance and rapid improvement in his mood, he was discharged in a few days. Minimal other medical treatment necessary. He was treated with carbamazepine, Haldol, and trazodone as primary psychiatric medicines.   DISCHARGE MEDICATIONS:  1. Trazodone 100 mg at bedtime.  2. Haldol 2 mg at bedtime.  3. Tegretol 200 mg twice a day.   LABORATORY RESULTS: Drug screen on admission positive for cannabis. Urinalysis unremarkable. TSH normal. Alcohol undetectable. Chemistry panel unremarkable. CBC normal.   MENTAL STATUS EXAM AT DISCHARGE: Casually dressed, reasonably well groomed man. Cooperative with the interview. Improved psychomotor activity and eye contact. Speech quiet but easy to understand. Thoughts appeared lucid with no grossly bizarre or disorganized thinking evident. Denied auditory or visual hallucinations. Denied suicidal or homicidal ideation. Showed improved judgment and insight, probably average intelligence.   DISPOSITION: Discharge home.   FOLLOW-UP: Follow-up with [facility].  DIAGNOSES PRINCIPLE AND PRIMARY:  AXIS I: Mood disorder, not otherwise specified.   SECONDARY DIAGNOSES:  AXIS I:  1. Cannabis  abuse.  2. Alcohol dependence, in sustained remission.   AXIS II: Deferred.   AXIS III: No diagnosis.   AXIS IV: Moderate to severe from chronic lack of resources.   AXIS V: Functioning at time of discharge is 56.   ____________________________ Audery AmelJohn T. Alec Mcphee, MD jtc:drc D: 04/02/2012 23:42:10 ET T: 04/04/2012 08:39:45 ET JOB#: [id number]  cc: Audery AmelJohn T. Christion Leonhard, MD, <Dictator> Audery AmelJOHN T Chriselda Leppert MD ELECTRONICALLY SIGNED 04/04/2012 12:34

## 2014-11-16 NOTE — Discharge Summary (Signed)
PATIENT NAME:  Curtis Reeves, Curtis Reeves MR#:  [id number] DATE OF BIRTH:  [birth year]  DATE OF ADMISSION:  03/17/2012 DATE OF DISCHARGE:  03/20/2012  HOSPITAL COURSE: See dictated History and Physical for details of admission. This 39 year old man with a history of anxiety and mood instability was admitted through the Emergency Room with complaints of being suicidal. He said that he had been under a lot of stress recently and he had been feeling more anxiety as well as symptoms of depression and irritability. He did not appear psychotic, and there was no clear history of substance abuse. He was not currently taking any psychiatric medication. In the hospital, the patient was treated with carbamazepine 200 mg twice a day, as well as Haldol 2 mg at night, and trazodone 100 mg at night. He did not display any dangerous or aggressive behaviors. He did not act out suicidally. He was able to participate in the ward routine fairly reasonably. By the time of discharge, his mood had improved. He was no longer reporting any suicidal or homicidal ideation and not showing any symptoms of psychosis. He was tolerating medication well. He was agreeable to an outpatient treatment plan.   DISCHARGE MEDICATIONS:  1. Trazodone 100 mg at bedtime.  2. Haldol 2 mg at bedtime.  3. Carbamazepine 200 mg b.i.d.   LABORATORY RESULTS: Laboratories done on admission showed a drug screen positive for cannabis, TSH normal, alcohol undetectable. Chemistry panel without any significant abnormalities. CBC was entirely normal. Urinalysis was normal.   DISCHARGE MENTAL STATUS EXAMINATION: A 39 year old man who looks his stated age, cooperative with the exam. Good eye contact. Normal psychomotor activity. Affect euthymic, reactive and appropriate. Mood stated as being better. Thoughts were lucid with no loosening of associations or delusional content. He denies suicidal or homicidal ideation. He denies psychotic symptoms. Intelligence appears  average. His judgment and insight appear adequate. He is showing appropriate planning and in agreement with an outpatient plan. Short-term memory is grossly intact.   DISPOSITION: Discharge home.   FOLLOWUP: Follow up with [facility].   DIAGNOSIS, PRINCIPAL AND PRIMARY:  AXIS I: Mood disorder, not otherwise specified.   SECONDARY DIAGNOSES:  AXIS I: Cannabis abuse.   AXIS II: Deferred.   AXIS III: None.   AXIS IV: Mental illness, substance abuse history, family conflict, moderate chronic stress.   AXIS V: Functioning at time of discharge 56.   ____________________________ Audery AmelJohn T. Clapacs, MD jtc:cbb D: 04/03/2012 23:12:48 ET T: 04/06/2012 15:48:15 ET JOB#: [id number]  cc: Audery AmelJohn T. Clapacs, MD, <Dictator> Audery AmelJOHN T CLAPACS MD ELECTRONICALLY SIGNED 04/07/2012 0:06

## 2014-11-16 NOTE — H&P (Signed)
PATIENT NAME:  Curtis Reeves, Curtis Reeves MR#:  [id number] DATE OF BIRTH:  1976-05-21  DATE OF ADMISSION:  03/17/2012  REFERRING PHYSICIAN: Janalyn Harder, MD  ATTENDING PHYSICIAN:  Kristine Linea, MD  IDENTIFYING DATA: Curtis Reeves is a 39 year old male with history of mood instability and anxiety.   CHIEF COMPLAINT: "I feel suicidal."  HISTORY OF PRESENT ILLNESS: Curtis Reeves has former diagnosis of bipolar affective disorder, panic disorder, intermittent explosive disorder, and depression. He has been off medication for an extended period of time. He feels under considerable stress after relocating from [place] to [place] where he is unable to work and support himself. He developed thoughts of hurting himself and with a plan to overdose. He does have a history of two past suicide attempts at least one of them by overdose on Xanax for which he was hospitalized in the CCU at [facility]. The patient reports extremely poor sleep, decreased appetite, anhedonia, feeling of guilt, worthlessness, hopelessness, heightened anxiety, poor memory and concentration, and social isolation but also feeling on edge, having flashbacks and nightmares, hypervigilance, and paranoia. The patient denies problems with alcohol or illicit substances. He denies any prescription pill misuse. There are no symptoms suggestive of bipolar mania frank psychosis.   PAST PSYCHIATRIC HISTORY: As above he had been treated in the past. He was a patient of Dr. Janeece Riggers at [facility]. He was treated with Vistaril which he believes made him lose his enamel from the teeth; he discontinued it. In the past, he was prescribed Xanax. He does not remember any medications that were prescribed for bipolar disorder like lithium, Depakote, or Tegretol. There were suicide attempts. The patient does not have insurance or transportation, therefore, his compliance is questionable.   FAMILY PSYCHIATRIC HISTORY: Mother with depression.   PAST  MEDICAL HISTORY: None.   ALLERGIES: No known drug allergies.   MEDICATIONS ON ADMISSION: None.   SOCIAL HISTORY: He has a third grade education. He has not been able to hold a job. Usually he gets mad and walks off. He underscores that he is not a leader but a follower, but it is hard for him to follow instructions as well. He used to live in [place] up until recently. 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 He relocated to [place] upon urging of his brother who believes that they two can work on cars and split the money 50-50. Since his arrival in [place], the patient has not been able to make a buck. 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  He is very frustrated.  REVIEW OF SYSTEMS: CONSTITUTIONAL: No fevers or chills. No weight changes. EYES: No double or blurred vision. ENT: No hearing loss. RESPIRATORY: No shortness of breath or cough. CARDIOVASCULAR: No chest pain or orthopnea. GASTROINTESTINAL: No abdominal pain, nausea, vomiting, or diarrhea. GU: No incontinence or frequency. ENDOCRINE: No heat or cold intolerance. LYMPHATIC: No anemia or easy bruising. INTEGUMENTARY: No acne or rash. MUSCULOSKELETAL: No muscle or joint pain. NEUROLOGIC: No tingling or weakness. PSYCHIATRIC: See history of present illness for details.   PHYSICAL EXAMINATION:   VITAL SIGNS: Blood pressure 152/88, pulse 58, respirations 18, and temperature 97.2.   GENERAL: This is a slender male in no acute distress.   HEENT: The pupils are equal, round, and reactive to light. Sclerae are anicteric. Extremely poor dentition.   NECK: Supple. No thyromegaly.   LUNGS: Clear to auscultation. No dullness to percussion.  HEART: Regular rhythm and rate. No murmurs, rubs, or gallops.   ABDOMEN: Soft, nontender, and nondistended. Positive bowel sounds.   MUSCULOSKELETAL: Normal muscle strength in all extremities.   SKIN: No rashes or  bruises.   LYMPHATIC: No cervical adenopathy.   NEUROLOGIC: Cranial nerves II through XII are intact.   LABORATORY DATA: Chemistries are within normal limits. Blood alcohol level is zero. LFTs within normal limits. TSH 1.52. Urine tox screen positive for cannabinoids. CBC within normal limits. Urinalysis is not suggestive of urinary tract infection.   MENTAL STATUS EXAMINATION ON ADMISSION: The patient is alert and oriented to person, place, time, and situation. He is pleasant, polite, and cooperative. He is wearing a yellow shirt and hospital burgundy scrubs. He maintains good eye contact. His speech is of normal rhythm, rate, and volume. Mood is depressed with flat affect. Thought processing is logical and goal oriented. Thought content - he endorses suicidal ideation with a plan to overdose on medication or step into traffic. There are no delusions or paranoia. There are no auditory or visual hallucinations. His cognition is grossly intact. He registers three out of three and recalls three out of three objects after 5 minutes. He can spell cat forward and backward. He knows the current president. His insight and judgment are fair.   SUICIDE RISK ASSESSMENT ON ADMISSION: This is a patient with a long history of anxiety, depression, and mood instability who attempted suicide twice in the past who became depressed in the context of social stressors and feels suicidal again.   DIAGNOSES:   AXIS I:  1. Major depressive disorder, recurrent, severe.  2. Marijuana abuse.   AXIS II: Deferred.   AXIS III: None.   AXIS IV: Mental illness, substance abuse, financial, housing, primary support, and access to care.   AXIS V: GAF on admission 25.   PLAN: The patient was admitted to [facility] Medicine unit for safety, stabilization and medication management. He was initially placed on suicide precautions and was closely monitored for any unsafe behavior. He underwent  full psychiatric and risk assessment. He received pharmacotherapy, individual and group psychotherapy, substance abuse counseling, and support from therapeutic milieu.  1. Suicidal ideation: The patient is able to contract for safety.  2. Mood: We will start him on Tegretol for mood stabilization, trazodone for sleep, and Haldol for paranoia. We will monitor his symptoms.  3. Marijuana abuse: The patient minimizes his problem. He reports that he was a serious alcoholic in the past but has been able to limit his substance abuse. He is not interested in substance abuse treatment. 4. Disposition: To be established. ____________________________ Ellin GoodieJolanta B. Jennet MaduroPucilowska, MD jbp:slb D: 03/17/2012 16:55:10 ET T: 03/17/2012 17:15:14 ET JOB#: [id number]  cc: Gianne Shugars B. Jennet MaduroPucilowska, MD, <Dictator> Shari ProwsJOLANTA B Fabien Travelstead MD ELECTRONICALLY SIGNED 03/24/2012 0:41

## 2014-11-19 NOTE — Consult Note (Signed)
Brief Consult Note: Diagnosis: Foreign body sensation.   Patient was seen by consultant.   Consult note dictated.   Recommend further assessment or treatment.   Discussed with Attending MD.   Comments: 39 y.o. male eating ice cream earlier tonight with abrupt onset of foreign body sensation.  Reports thinks it was plastic from ice cream container.  Constantly clearing throat.  Tolerating secretions.  No airway issues.  Neck x-ray negative.  PE- Gen- NAD, no stridor Ears- clear Nose- clear OC/OP- clear to palpation of tongue, FOM, tonsils, vallecula  Laryngoscopy- no evidence of foreign body in supraglottis, glottis, base of tongue, tonsils, piriform sinuses clear with no pooling of secretions.  Scope repeated X3 with placement of scope into piriform sinuses and post-cricoid region with no evidence of FB.  Impression:  FB sensation with no evidence of foreign body in upper airway  Plan:  No evidence of foreign body in upper airway or piriform sinuses.  If concerned would rec. GI evaluation for possible endoscopy.  I did discuss with the patient the possibility of trauma from a passed FB continuing to give him the sensation that something was still there as well.  Electronic Signatures: Flossie DibbleVaught, Kristiane Morsch Charles (MD)  (Signed 25-Jun-14 22:09)  Authored: Brief Consult Note   Last Updated: 25-Jun-14 22:09 by Flossie DibbleVaught, Shed Nixon Charles (MD)
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
## 2014-11-20 NOTE — Consult Note (Signed)
Brief Consult Note: Diagnosis: bipol;ar disorder manic.   Patient was seen by consultant.   Consult note dictated.   Recommend further assessment or treatment.   Orders entered.   Discussed with Attending MD.   Comments: Psychiatry: Patient seen and chart reviewed and note dictated. Patient with history of mood instability and substance abuse currently agitated and disorganized. Admit for stabiliozation and concern of dangerousness to others.  Electronic Signatures: Audery Amellapacs, John T (MD)  (Signed 03-Sep-15 16:00)  Authored: Brief Consult Note   Last Updated: 03-Sep-15 16:00 by Audery Amellapacs, John T (MD)

## 2018-05-10 ENCOUNTER — Other Ambulatory Visit: Payer: Self-pay

## 2018-05-10 ENCOUNTER — Emergency Department
Admission: EM | Admit: 2018-05-10 | Discharge: 2018-05-10 | Disposition: A | Discharge status: Home / Self Care | Payer: Self-pay | Attending: Emergency Medicine | Admitting: Emergency Medicine

## 2018-05-10 ENCOUNTER — Encounter: Payer: Self-pay | Admitting: Emergency Medicine

## 2018-05-10 ENCOUNTER — Emergency Department: Payer: Self-pay

## 2018-05-10 DIAGNOSIS — F17210 Nicotine dependence, cigarettes, uncomplicated: Secondary | ICD-10-CM | POA: Insufficient documentation

## 2018-05-10 DIAGNOSIS — M779 Enthesopathy, unspecified: Secondary | ICD-10-CM | POA: Insufficient documentation

## 2018-05-10 DIAGNOSIS — J45909 Unspecified asthma, uncomplicated: Secondary | ICD-10-CM | POA: Insufficient documentation

## 2018-05-10 DIAGNOSIS — X500XXA Overexertion from strenuous movement or load, initial encounter: Secondary | ICD-10-CM | POA: Insufficient documentation

## 2018-05-10 DIAGNOSIS — M778 Other enthesopathies, not elsewhere classified: Secondary | ICD-10-CM

## 2018-05-10 HISTORY — DX: Unspecified asthma, uncomplicated: J45.909

## 2018-05-10 MED ORDER — IBUPROFEN 600 MG PO TABS: 600.0000 mg | ORAL_TABLET | Freq: Three times a day (TID) | ORAL | 0 refills | Status: AC | PRN

## 2018-05-10 MED ORDER — OXYCODONE-ACETAMINOPHEN 5-325 MG PO TABS
1.0000 | ORAL_TABLET | Freq: Once | ORAL | Status: AC
  Administered 2018-05-10: 1 via ORAL
  Filled 2018-05-10: qty 1

## 2018-05-10 MED ORDER — IBUPROFEN 600 MG PO TABS
600.0000 mg | ORAL_TABLET | Freq: Once | ORAL | Status: AC
  Administered 2018-05-10: 600 mg via ORAL
  Filled 2018-05-10: qty 1

## 2018-05-10 MED ORDER — OXYCODONE-ACETAMINOPHEN 7.5-325 MG PO TABS: 1.0000 | ORAL_TABLET | Freq: Four times a day (QID) | ORAL | 0 refills | Status: AC | PRN

## 2018-05-10 NOTE — Discharge Instructions (Addendum)
Follow discharge care instruction take medication as directed. °

## 2018-05-10 NOTE — ED Provider Notes (Signed)
[facility] Emergency Department Provider Note   ____________________________________________   First MD Initiated Contact with Patient 05/10/18 1358     (approximate)  I have reviewed the triage vital signs and the nursing notes.   HISTORY  Chief Complaint Arm Pain    HPI Curtis Reeves is a 42 y.o. male patient complain of left forearm and wrist pain secondary to a heavy lifting incident today.  Patient states there is a knot on the dorsal aspect of the left forearm.  Patient rates pain a 7/10.  Patient described pain is "aching".  Patient did pain increased with pronation.  Patient denies loss of sensation.  Patient is left-hand dominant.  Past Medical History:  Diagnosis Date  . Asthma     There are no active problems to display for this patient.   History reviewed. No pertinent surgical history.  Prior to Admission medications   Not on File    Allergies Tramadol  No family history on file.  Social History Social History   Tobacco Use  . Smoking status: Current Every Day Smoker    Packs/day: 2.00    Types: Cigarettes  . Smokeless tobacco: Never Used  Substance Use Topics  . Alcohol use: Yes  . Drug use: Yes    Types: Marijuana    Review of Systems Constitutional: No fever/chills Eyes: No visual changes. ENT: No sore throat. Cardiovascular: Denies chest pain. Respiratory: Denies shortness of breath. Gastrointestinal: No abdominal pain.  No nausea, no vomiting.  No diarrhea.  No constipation. Genitourinary: Negative for dysuria. Musculoskeletal: Left forearm pain. Skin: Negative for rash. Neurological: Negative for headaches, focal weakness or numbness. Allergic/Immunilogical: Tramadol  ____________________________________________   PHYSICAL EXAM:  VITAL SIGNS: ED Triage Vitals  Enc Vitals Group     BP 05/10/18 1346 (!) 145/85     Pulse Rate 05/10/18 1346 79     Resp 05/10/18 1346 18     Temp 05/10/18 1346 98.1  F (36.7 C)     Temp Source 05/10/18 1346 Oral     SpO2 05/10/18 1346 100 %     Weight 05/10/18 1347 150 lb (68 kg)     Height 05/10/18 1347 5\' 9"  (1.753 m)     Head Circumference --      Peak Flow --      Pain Score 05/10/18 1346 7     Pain Loc --      Pain Edu? --      Excl. in GC? --    Constitutional: Alert and oriented. Well appearing and in no acute distress. Cardiovascular: Normal rate, regular rhythm. Grossly normal heart sounds.  Good peripheral circulation. Respiratory: Normal respiratory effort.  No retractions. Lungs CTAB. Musculoskeletal: No obvious deformity to the left forearm/wrist.  Patient is full neck range of motion.  Patient is tender to palpation midshaft radius and ulna.. Neurologic:  Normal speech and language. No gross focal neurologic deficits are appreciated. No gait instability. Skin:  Skin is warm, dry and intact. No rash noted. Psychiatric: Mood and affect are normal. Speech and behavior are normal.  ____________________________________________   LABS (all labs ordered are listed, but only abnormal results are displayed)  Labs Reviewed - No data to display ____________________________________________  EKG   ____________________________________________  RADIOLOGY  ED MD interpretation:    Official radiology report(s): Dg Forearm Left  Result Date: 05/10/2018 CLINICAL DATA:  Heavy lifting 1 hour ago with persistent arm and wrist pain, initial encounter EXAM: LEFT FOREARM - 2  VIEW COMPARISON:  None. FINDINGS: There is no evidence of fracture or other focal bone lesions. Soft tissues are unremarkable. IMPRESSION: No acute abnormality noted. Electronically Signed   By: Alcide Clever M.D.   On: 05/10/2018 14:43    ____________________________________________   PROCEDURES  Procedure(s) performed: None  Procedures  Critical Care performed: No  ____________________________________________   INITIAL IMPRESSION / ASSESSMENT AND PLAN / ED  COURSE  As part of my medical decision making, I reviewed the following data within the electronic MEDICAL RECORD NUMBER    Left forearm pain secondary to tendinitis.  Discussed negative x-ray findings with patient.  Patient given discharge care instructions and a prescription for Percocet and ibuprofen.  Patient advised [facility].      ____________________________________________   FINAL CLINICAL IMPRESSION(S) / ED DIAGNOSES  Final diagnoses:  Tendinitis of left forearm     ED Discharge Orders    None       Note:  This document was prepared using Dragon voice recognition software and may include unintentional dictation errors.    Joni Reining, PA-C 05/10/18 1501    Phineas Semen, MD 05/11/18 1101

## 2018-05-10 NOTE — ED Triage Notes (Signed)
Pt to ed with c/o left arm and wrist pain after lifting heavy wood about 1 hour pta.  Pt states arm now has a "knot" on it and is very painful to touch.

## 2018-05-10 NOTE — ED Notes (Signed)
Pt lifted a large log when he attempted to throw it onto a trailer felt pain - pt treports pain and swelling of left wrist pain radiates

## 2019-12-15 IMAGING — DX DG FOREARM 2V*L*
2 series · 2 of 2 positions shown · non-contrast
Comparison: None.

CLINICAL DATA: Heavy lifting 1 hour ago with persistent arm and
wrist pain, initial encounter

EXAM:
LEFT FOREARM - 2 VIEW

[forearm ap]
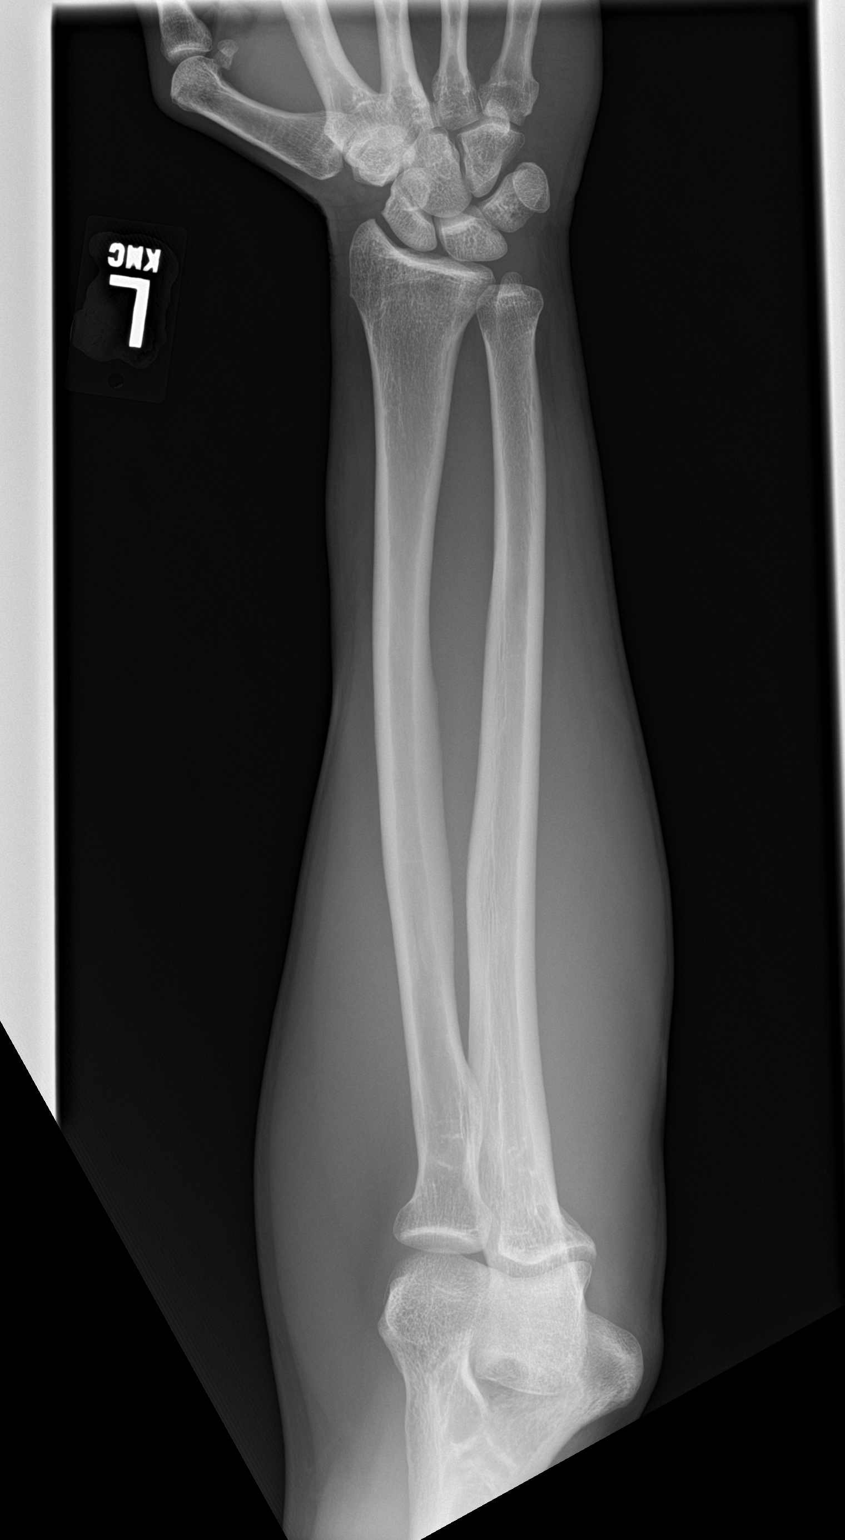

[forearm lat]
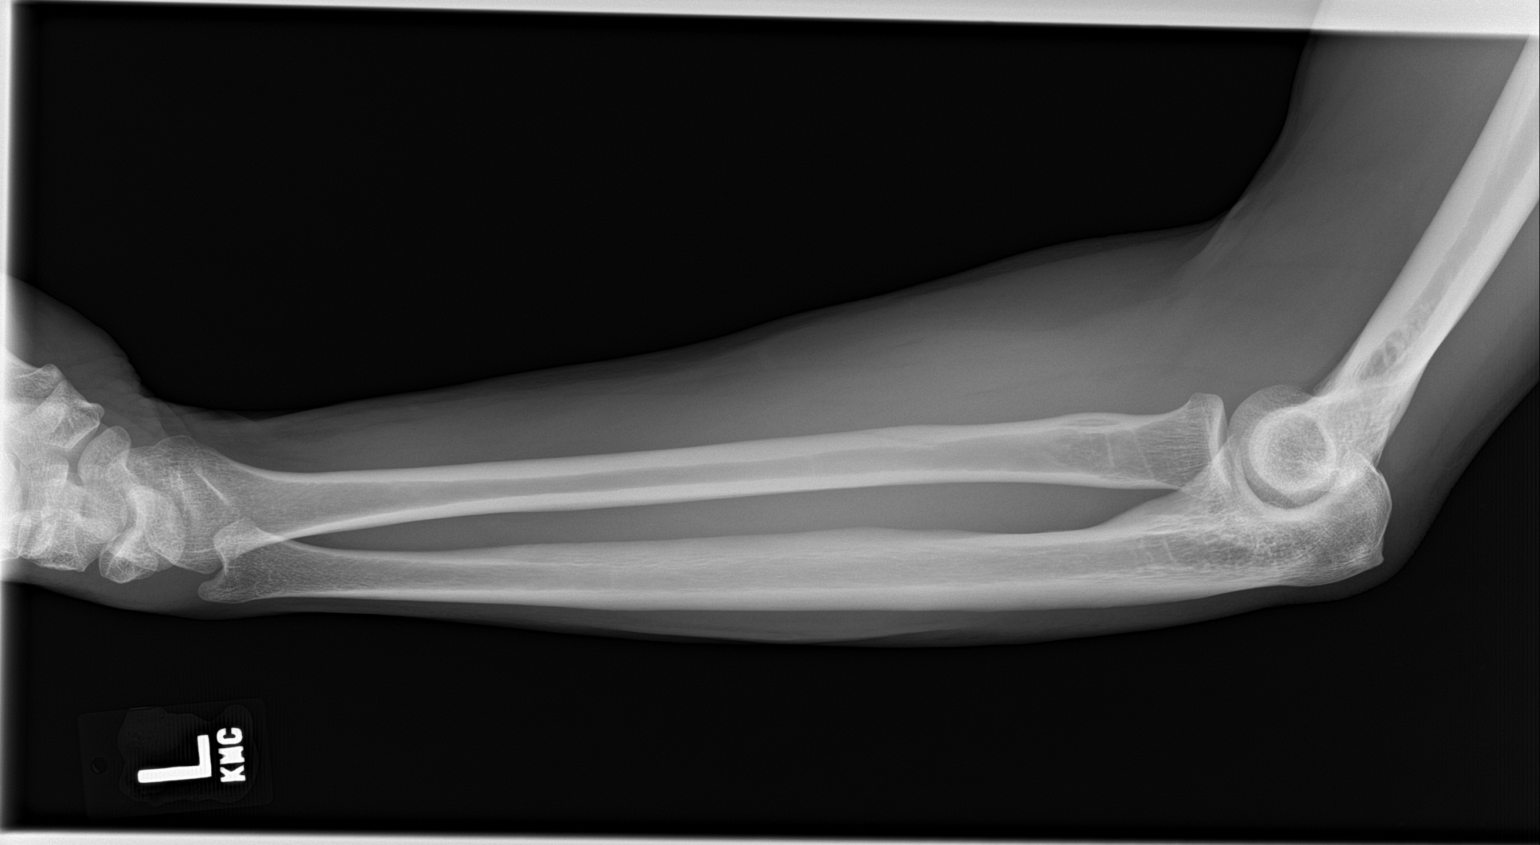

[2 of 2 positions shown; findings below may reference images not displayed]

FINDINGS: There is no evidence of fracture or other focal bone lesions. Soft
tissues are unremarkable.
IMPRESSION: No acute abnormality noted.
